# Patient Record
Sex: Male | Born: 1990 | Race: White | Hispanic: No | Marital: Single | Smoking: Never smoker
Health system: Southern US, Community
[De-identification: ages and names within clinical notes are randomized; demographics above are authoritative.]

## PROBLEM LIST (undated history)

## (undated) DIAGNOSIS — T7840XA Allergy, unspecified, initial encounter: Secondary | ICD-10-CM

## (undated) DIAGNOSIS — F32A Depression, unspecified: Secondary | ICD-10-CM

## (undated) DIAGNOSIS — F988 Other specified behavioral and emotional disorders with onset usually occurring in childhood and adolescence: Secondary | ICD-10-CM

## (undated) DIAGNOSIS — F419 Anxiety disorder, unspecified: Secondary | ICD-10-CM

## (undated) HISTORY — PX: TONSILLECTOMY: SUR1361

## (undated) HISTORY — DX: Other specified behavioral and emotional disorders with onset usually occurring in childhood and adolescence: F98.8

## (undated) HISTORY — PX: OTHER SURGICAL HISTORY: SHX169

## (undated) HISTORY — DX: Allergy, unspecified, initial encounter: T78.40XA

---

## 2012-08-03 ENCOUNTER — Ambulatory Visit: Payer: BC Managed Care – PPO

## 2012-08-03 ENCOUNTER — Ambulatory Visit (INDEPENDENT_AMBULATORY_CARE_PROVIDER_SITE_OTHER): Payer: BC Managed Care – PPO | Admitting: Family Medicine

## 2012-08-03 VITALS — BP 123/75 | HR 86 | Temp 98.4°F | Resp 16 | Ht 69.0 in | Wt 151.0 lb

## 2012-08-03 DIAGNOSIS — Z0000 Encounter for general adult medical examination without abnormal findings: Secondary | ICD-10-CM

## 2012-08-03 DIAGNOSIS — Z23 Encounter for immunization: Secondary | ICD-10-CM

## 2012-08-03 NOTE — Progress Notes (Signed)
[address]   [place], [place]  [address]   [phone]  Subjective:    Patient ID: Gregory Mcdonald, male    DOB: April 27, 1991, 21 y.o.   MRN: [id number]  HPIThis 21 y.o. male presents for evaluation of CPE.  Last physical 2009.  Tetanus vaccine UTD.  Flu vaccine this visit.  Hepatitis B series in past.  Gardisil never.  Eye exam recently; +glasses and contacts. Dental exam every six months.     Review of Systems  Constitutional: Negative for fever, chills, diaphoresis, activity change, appetite change, fatigue and unexpected weight change.  HENT: Positive for voice change. Negative for hearing loss, ear pain, nosebleeds, congestion, sore throat, facial swelling, rhinorrhea, sneezing, drooling, mouth sores, trouble swallowing, neck pain, neck stiffness, dental problem, postnasal drip, sinus pressure, tinnitus and ear discharge.   Eyes: Negative for photophobia, pain, discharge, redness, itching and visual disturbance.  Respiratory: Negative for apnea, cough, choking, chest tightness, shortness of breath, wheezing and stridor.   Cardiovascular: Negative for chest pain, palpitations and leg swelling.  Gastrointestinal: Negative for nausea, vomiting, abdominal pain, diarrhea and constipation.  Genitourinary: Negative for urgency, frequency, hematuria, discharge, genital sores, penile pain and testicular pain.  Musculoskeletal: Negative for myalgias, back pain, joint swelling, arthralgias and gait problem.  Skin: Negative for color change, pallor, rash and wound.  Neurological: Negative for dizziness, tremors, syncope, weakness, light-headedness, numbness and headaches.  Hematological: Negative for adenopathy. Does not bruise/bleed easily.  Psychiatric/Behavioral: Negative for suicidal ideas, hallucinations, behavioral problems, confusion, disturbed wake/sleep cycle, self-injury and dysphoric mood. The patient is not nervous/anxious and is not hyperactive.         Past Medical History    Diagnosis Date  . Allergy   . ADD (attention deficit disorder)     Diagnosed age 52.  Last treatment 2012; Ritalin PRN but does not tolerate side effects.    Past Surgical History  Procedure Date  . Tonsillectomy   . Admission     age 33 for constipation.    Prior to Admission medications   Not on File    No Known Allergies  History   Social History  . Marital Status: Single    Spouse Name: N/A    Number of Children: N/A  . Years of Education: N/A   Occupational History  . Not on file.   Social History Main Topics  . Smoking status: Never Smoker   . Smokeless tobacco: Never Used  . Alcohol Use: 6.0 oz/week    10 Cans of beer per week  . Drug Use: Yes    Special: Marijuana  . Sexually Active: Yes    Birth Control/ Protection: None, Condom     Last sexual activity 04/2012.  Total partners=1; male partners only.   Other Topics Concern  . Not on file   Social History Narrative   Marital: single; casually dating; just ended relationship after 2.5 years.  Lives: on campus with suite mates.   Children: none   Education: currently in school at [employer]; senior; major geography.   Study abroad; wants to go to [place].  Employed: none    Family History  Problem Relation Age of Onset  . Depression Father     Objective:   Physical Exam  Nursing note and vitals reviewed. Constitutional: He is oriented to person, place, and time. He appears well-developed and well-nourished. No distress.  HENT:  Head: Normocephalic and atraumatic.  Right Ear: External ear normal.  Left Ear: External ear normal.  Nose: Nose normal.  Mouth/Throat: Oropharynx is clear and moist.  Eyes: Conjunctivae normal and EOM are normal. Pupils are equal, round, and reactive to light.  Neck: Normal range of motion. Neck supple. No thyromegaly present.  Cardiovascular: Normal rate, regular rhythm, normal heart sounds and intact distal pulses.   No murmur heard. Pulmonary/Chest: Effort normal and  breath sounds normal. No respiratory distress. He has no wheezes. He has no rales.  Abdominal: Soft. Bowel sounds are normal. He exhibits no distension. There is no tenderness. There is no rebound and no guarding. Hernia confirmed negative in the right inguinal area and confirmed negative in the left inguinal area.  Genitourinary: Testes normal and penis normal. Right testis shows no mass, no swelling and no tenderness. Left testis shows no mass, no swelling and no tenderness.  Musculoskeletal: Normal range of motion.  Lymphadenopathy:    He has no cervical adenopathy.       Right: No inguinal adenopathy present.       Left: No inguinal adenopathy present.  Neurological: He is alert and oriented to person, place, and time. He has normal reflexes.  Skin: Skin is warm and dry. No rash noted. He is not diaphoretic. No erythema.       LARGE HYPERPIGMENTED MACULAR RASH L ANTERIOR ABDOMEN 10 CM X 14 CM.  Psychiatric: He has a normal mood and affect. His behavior is normal. Judgment and thought content normal.       [facility] reading (PRIMARY) by  Dr. Katrinka Blazing.  CXR: NAD.  INFLUENZA VACCINE ADMINISTERED.   Assessment & Plan:   1. Routine general medical examination at a health care facility  DG Chest 2 View  2.  Influenza Vaccine   1.  CPE: anticipatory guidance ----   Safe sex practices reviewed; recommend seatbelt use at all times.  Reviewed immunizations; to consider Gardisil series in future.  S/p influenza vaccine.  No evidence of depression.  Normal growth and development. 2. Influenza vaccine administered.

## 2012-09-14 NOTE — Progress Notes (Signed)
Reviewed and agree.

## 2016-10-17 ENCOUNTER — Emergency Department (HOSPITAL_COMMUNITY): Payer: Managed Care, Other (non HMO)

## 2016-10-17 ENCOUNTER — Emergency Department (HOSPITAL_COMMUNITY)
Admission: EM | Admit: 2016-10-17 | Discharge: 2016-10-17 | Disposition: A | Discharge status: Home / Self Care | Payer: Managed Care, Other (non HMO) | Attending: Emergency Medicine | Admitting: Emergency Medicine

## 2016-10-17 ENCOUNTER — Encounter (HOSPITAL_COMMUNITY): Payer: Self-pay | Admitting: Emergency Medicine

## 2016-10-17 DIAGNOSIS — F909 Attention-deficit hyperactivity disorder, unspecified type: Secondary | ICD-10-CM | POA: Insufficient documentation

## 2016-10-17 DIAGNOSIS — R55 Syncope and collapse: Secondary | ICD-10-CM | POA: Diagnosis not present

## 2016-10-17 DIAGNOSIS — K529 Noninfective gastroenteritis and colitis, unspecified: Secondary | ICD-10-CM | POA: Diagnosis not present

## 2016-10-17 DIAGNOSIS — R112 Nausea with vomiting, unspecified: Secondary | ICD-10-CM | POA: Diagnosis present

## 2016-10-17 LAB — URINALYSIS, ROUTINE W REFLEX MICROSCOPIC
BACTERIA UA: NONE SEEN
BILIRUBIN URINE: NEGATIVE
Glucose, UA: NEGATIVE mg/dL
Hgb urine dipstick: NEGATIVE
Ketones, ur: 5 mg/dL — AB
Leukocytes, UA: NEGATIVE
Nitrite: NEGATIVE
PH: 6 (ref 5.0–8.0)
PROTEIN: 30 mg/dL — AB
Specific Gravity, Urine: 1.028 (ref 1.005–1.030)
Squamous Epithelial / LPF: NONE SEEN

## 2016-10-17 LAB — COMPREHENSIVE METABOLIC PANEL
ALT: 26 U/L (ref 17–63)
AST: 25 U/L (ref 15–41)
Albumin: 4.3 g/dL (ref 3.5–5.0)
Alkaline Phosphatase: 46 U/L (ref 38–126)
Anion gap: 9 (ref 5–15)
BUN: 20 mg/dL (ref 6–20)
CHLORIDE: 95 mmol/L — AB (ref 101–111)
CO2: 33 mmol/L — AB (ref 22–32)
CREATININE: 0.96 mg/dL (ref 0.61–1.24)
Calcium: 8.6 mg/dL — ABNORMAL LOW (ref 8.9–10.3)
Glucose, Bld: 120 mg/dL — ABNORMAL HIGH (ref 65–99)
POTASSIUM: 3.8 mmol/L (ref 3.5–5.1)
SODIUM: 137 mmol/L (ref 135–145)
Total Bilirubin: 0.8 mg/dL (ref 0.3–1.2)
Total Protein: 7.2 g/dL (ref 6.5–8.1)

## 2016-10-17 LAB — CBC
HEMATOCRIT: 47.7 % (ref 39.0–52.0)
HEMOGLOBIN: 16.9 g/dL (ref 13.0–17.0)
MCH: 29.6 pg (ref 26.0–34.0)
MCHC: 35.4 g/dL (ref 30.0–36.0)
MCV: 83.7 fL (ref 78.0–100.0)
Platelets: 169 10*3/uL (ref 150–400)
RBC: 5.7 MIL/uL (ref 4.22–5.81)
RDW: 12.9 % (ref 11.5–15.5)
WBC: 8.1 10*3/uL (ref 4.0–10.5)

## 2016-10-17 LAB — LIPASE, BLOOD: LIPASE: 20 U/L (ref 11–51)

## 2016-10-17 MED ORDER — ONDANSETRON 8 MG PO TBDP: 8.0000 mg | ORAL_TABLET | Freq: Three times a day (TID) | ORAL | 0 refills | Status: DC | PRN

## 2016-10-17 MED ORDER — SODIUM CHLORIDE 0.9 % IV SOLN: INTRAVENOUS | Status: DC

## 2016-10-17 MED ORDER — SODIUM CHLORIDE 0.9 % IV BOLUS (SEPSIS)
2000.0000 mL | Freq: Once | INTRAVENOUS | Status: AC
  Administered 2016-10-17: 2000 mL via INTRAVENOUS

## 2016-10-17 MED ORDER — ONDANSETRON 4 MG PO TBDP
4.0000 mg | ORAL_TABLET | Freq: Once | ORAL | Status: AC | PRN
  Administered 2016-10-17: 4 mg via ORAL
  Filled 2016-10-17: qty 1

## 2016-10-17 NOTE — ED Triage Notes (Signed)
Pt reports intermittent nv/d for the past 3 days. Fell 2 nights ago and hit the back of his head and has felt confused since then. Keeping fluids down intermittently.

## 2016-10-17 NOTE — ED Provider Notes (Signed)
[facility]-EMERGENCY DEPT Provider Note   CSN: [id number] Arrival date & time: 10/17/16  0747     History   Chief Complaint Chief Complaint  Patient presents with  . Emesis  . Fall    HPI YGNACIO FECTEAU is a 26 y.o. male.  26 year old male presents with nausea vomiting diarrhea 3 days which is been intermittent. Patient states he is having increased amounts of alcohol for the past 2 days. Symptoms initially got better but then returned today. Did strike the back of his head a few days ago but denies any confusion. No abdominal discomfort. His emesis has been nonbilious or bloody. Denies any diarrhea. No fever or chills. Was concerned he may have had food poisoning. Denies any visual changes. No treatment use prior to arrival      Past Medical History:  Diagnosis Date  . ADD (attention deficit disorder)    Diagnosed age 58.  Last treatment 2012; Ritalin PRN but does not tolerate side effects.  . Allergy     There are no active problems to display for this patient.   Past Surgical History:  Procedure Laterality Date  . Admission     age 53 for constipation.  . TONSILLECTOMY         Home Medications    Prior to Admission medications   Not on File    Family History Family History  Problem Relation Age of Onset  . Depression Father     Social History Social History  Substance Use Topics  . Smoking status: Never Smoker  . Smokeless tobacco: Never Used  . Alcohol use 6.0 oz/week    10 Cans of beer per week     Allergies   Patient has no known allergies.   Review of Systems Review of Systems  All other systems reviewed and are negative.    Physical Exam Updated Vital Signs BP 112/69 (BP Location: Left Arm)   Pulse 95   Temp 98.7 F (37.1 C) (Oral)   Resp 18   SpO2 98%   Physical Exam  Constitutional: He is oriented to person, place, and time. He appears well-developed and well-nourished.  Non-toxic appearance. No distress.  HENT:  Head:  Normocephalic and atraumatic.  Eyes: Conjunctivae, EOM and lids are normal. Pupils are equal, round, and reactive to light.  Neck: Normal range of motion. Neck supple. No tracheal deviation present. No thyroid mass present.  Cardiovascular: Normal rate, regular rhythm and normal heart sounds.  Exam reveals no gallop.   No murmur heard. Pulmonary/Chest: Effort normal and breath sounds normal. No stridor. No respiratory distress. He has no decreased breath sounds. He has no wheezes. He has no rhonchi. He has no rales.  Abdominal: Soft. Normal appearance and bowel sounds are normal. He exhibits no distension. There is no tenderness. There is no rebound and no CVA tenderness.  Musculoskeletal: Normal range of motion. He exhibits no edema or tenderness.  Neurological: He is alert and oriented to person, place, and time. He has normal strength. No cranial nerve deficit or sensory deficit. GCS eye subscore is 4. GCS verbal subscore is 5. GCS motor subscore is 6.  Skin: Skin is warm and dry. No abrasion and no rash noted.  Psychiatric: He has a normal mood and affect. His speech is normal and behavior is normal.  Nursing note and vitals reviewed.    ED Treatments / Results  Labs (all labs ordered are listed, but only abnormal results are displayed) Labs Reviewed  COMPREHENSIVE METABOLIC  PANEL - Abnormal; Notable for the following:       Result Value   Chloride 95 (*)    CO2 33 (*)    Glucose, Bld 120 (*)    Calcium 8.6 (*)    All other components within normal limits  LIPASE, BLOOD  CBC  URINALYSIS, ROUTINE W REFLEX MICROSCOPIC    EKG  EKG Interpretation None       Radiology No results found.  Procedures Procedures (including critical care time)  Medications Ordered in ED Medications  0.9 %  sodium chloride infusion (not administered)  sodium chloride 0.9 % bolus 2,000 mL (not administered)  ondansetron (ZOFRAN-ODT) disintegrating tablet 4 mg (4 mg Oral Given 10/17/16 0805)      Initial Impression / Assessment and Plan / ED Course  I have reviewed the triage vital signs and the nursing notes.  Pertinent labs & imaging results that were available during my care of the patient were reviewed by me and considered in my medical decision making (see chart for details).  Clinical Course     Patient given IV fluids anti-medics here. Had a head CT due to history of head trauma which is negative for acute intracranial process. Suspect that patient did have some component of gastritis due to his current symptoms. Has a negative abdominal exam. Stable for discharge  Final Clinical Impressions(s) / ED Diagnoses   Final diagnoses:  None    New Prescriptions New Prescriptions   No medications on file     Lorre NickAnthony Keyerra Lamere, MD 10/17/16 1136

## 2017-12-14 IMAGING — CT CT HEAD W/O CM
3 of 4 series · 15 of 47 positions shown, 18 images · non-contrast
Comparison: None.

CLINICAL DATA: Intermittent nausea, vomiting, and diarrhea for 3
days, fell 2 nights ago hitting the back of the head with loss of
consciousness

EXAM:
CT HEAD WITHOUT CONTRAST
TECHNIQUE: Contiguous axial images were obtained from the base of the skull
through the vertex without intravenous contrast.

[Series 2: head w/o · axial · non-contrast · 0.45mm/px · z∈[-121,+4]mm · 9 of 33 slices shown, 12 images]
[im 4/33  brain]
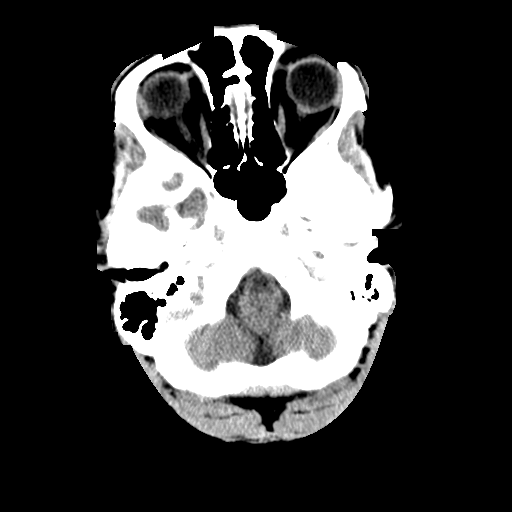
[im 4/33  bone]
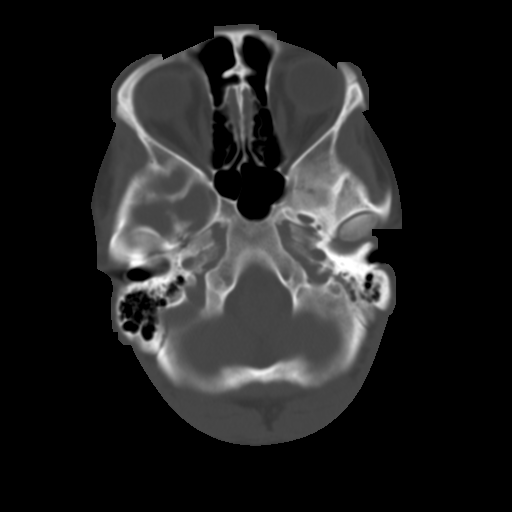
[im 7/33  brain]
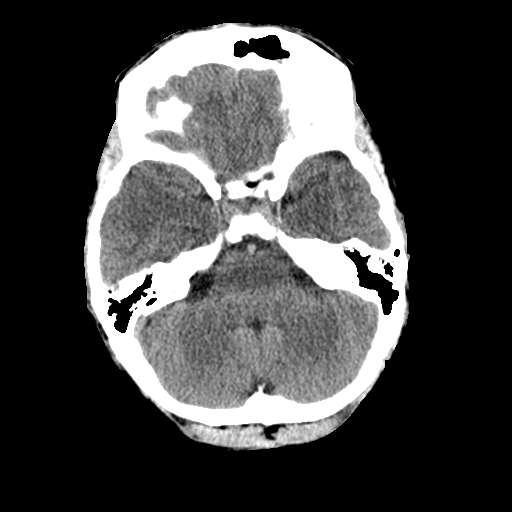
[im 10/33  brain]
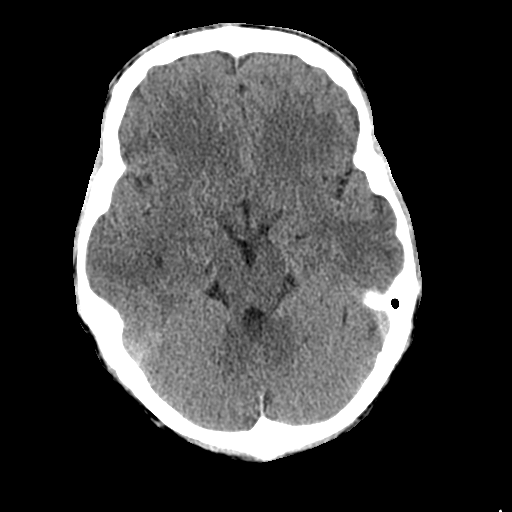
[im 13/33  brain]
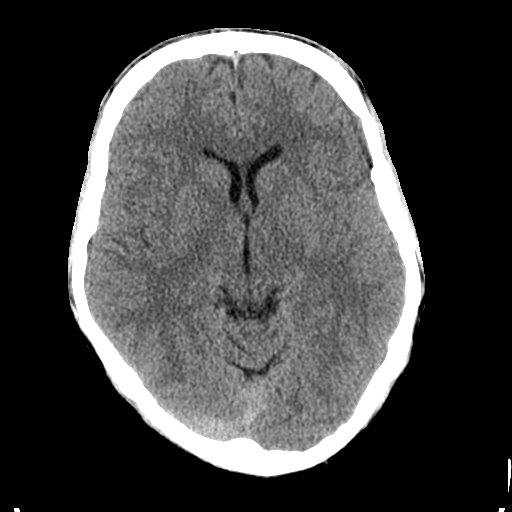
[im 17/33  brain]
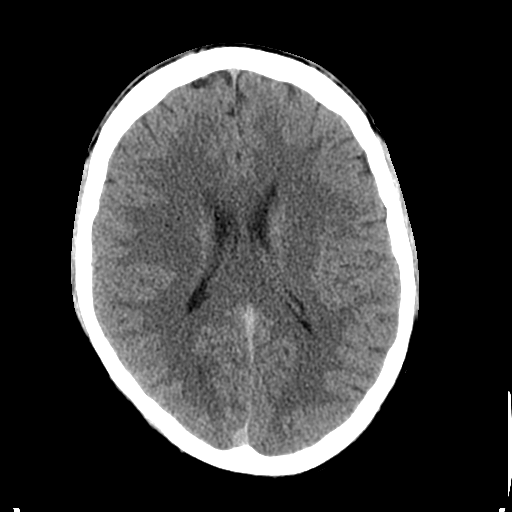
[im 17/33  bone]
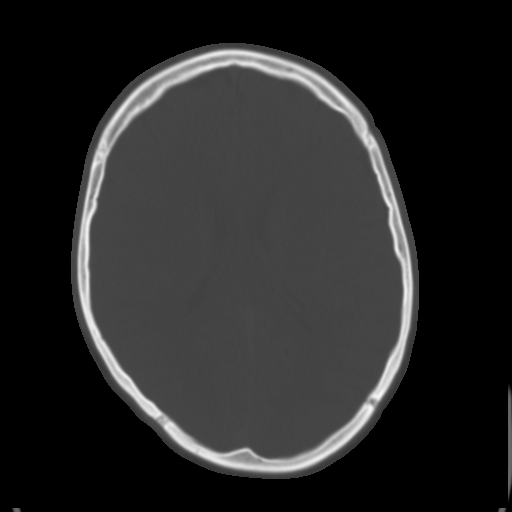
[im 20/33  brain]
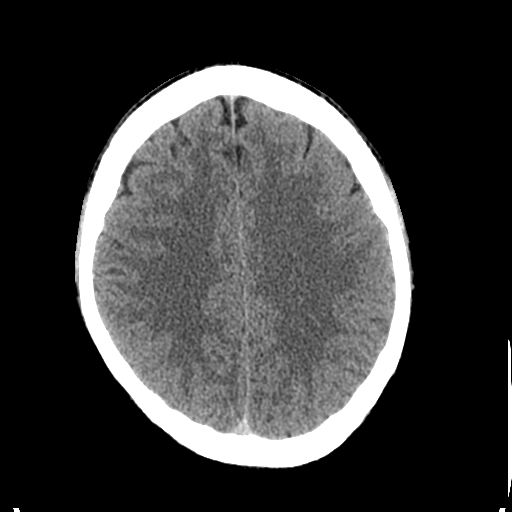
[im 23/33  brain]
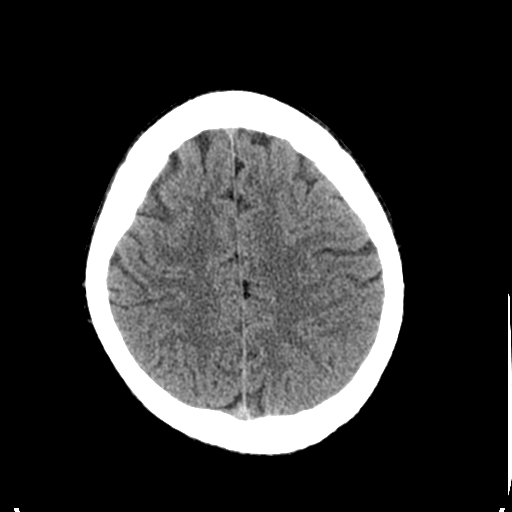
[im 26/33  brain]
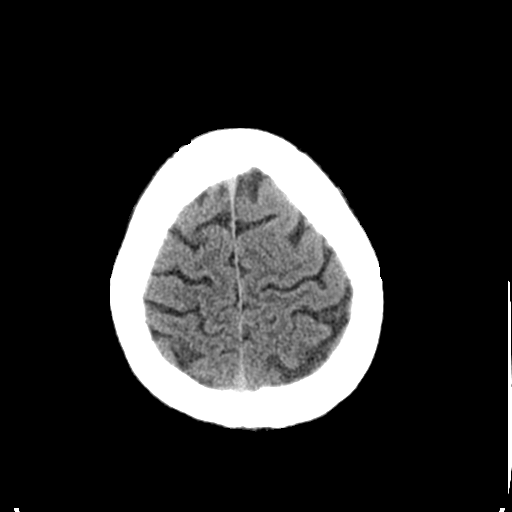
[im 29/33  brain]
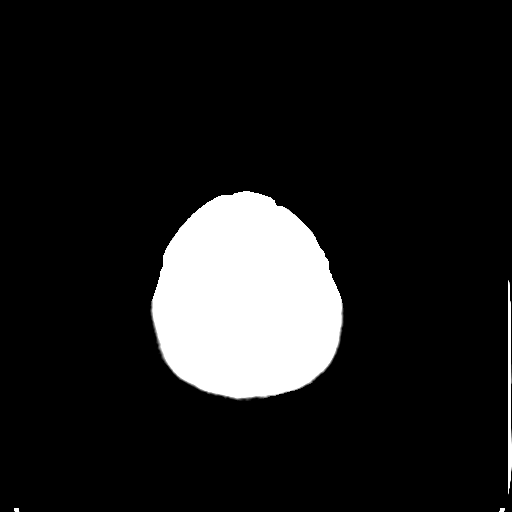
[im 29/33  bone]
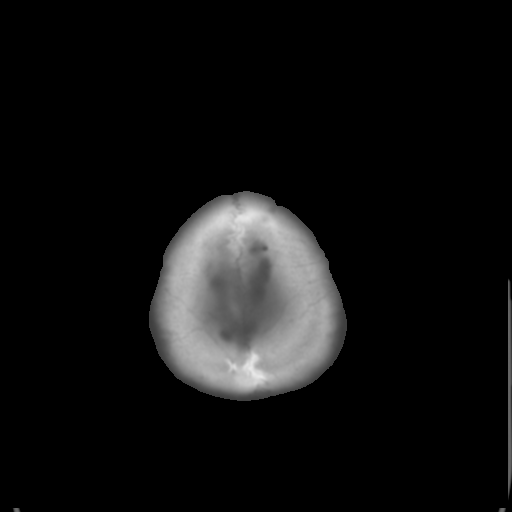

[Series 5: coronal · coronal · 0.31mm/px · 3 of 72 slices shown]
[im 24/72  brain]
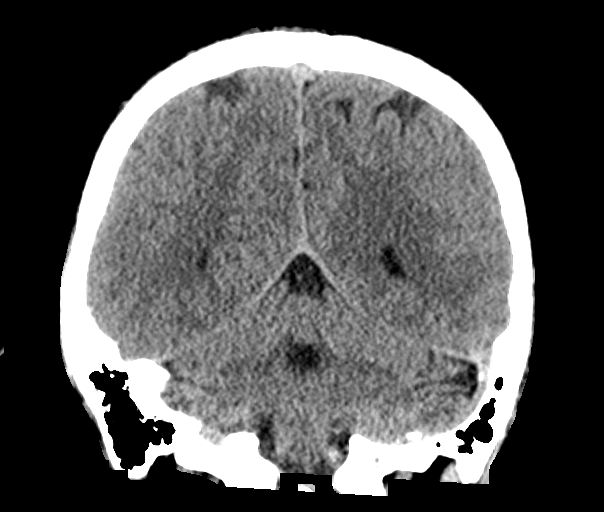
[im 32/72  brain]
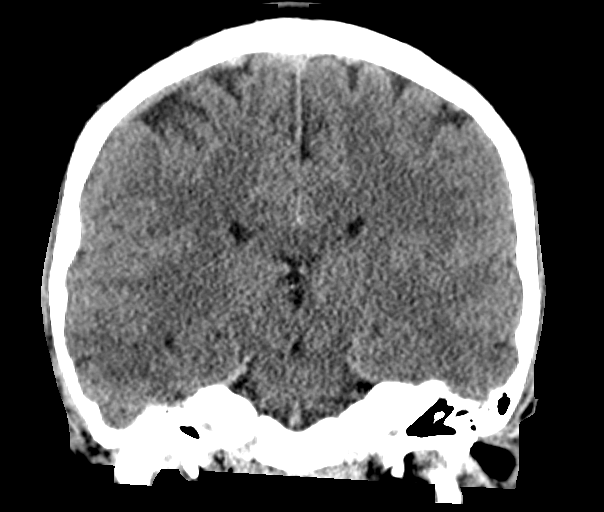
[im 40/72  brain]
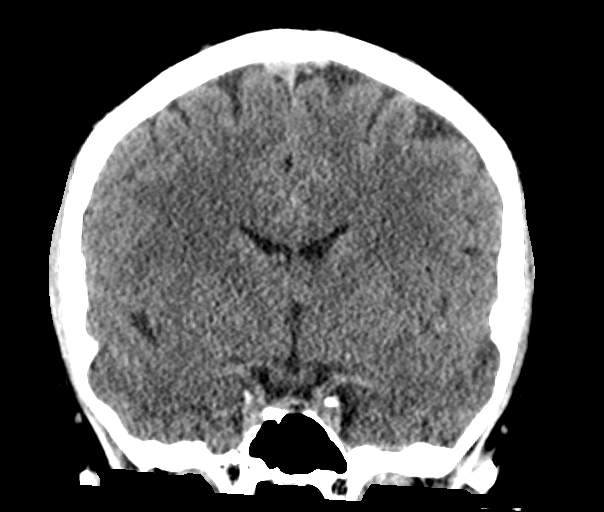

[Series 6: sagittal · sagittal · 0.31mm/px · 3 of 64 slices shown]
[im 22/64  brain]
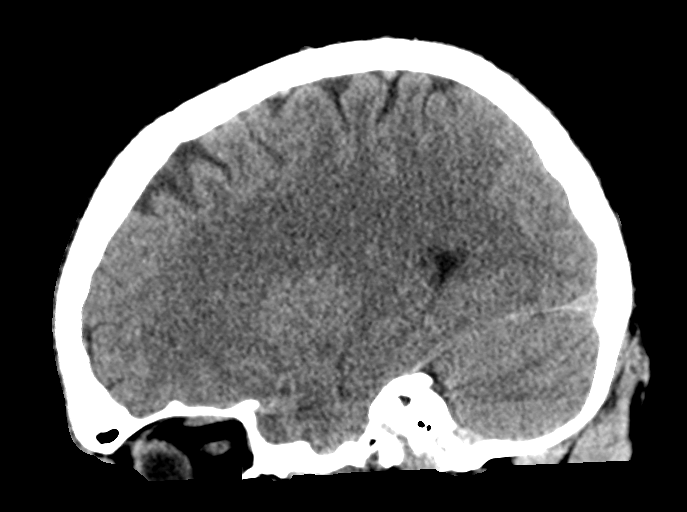
[im 32/64  brain]
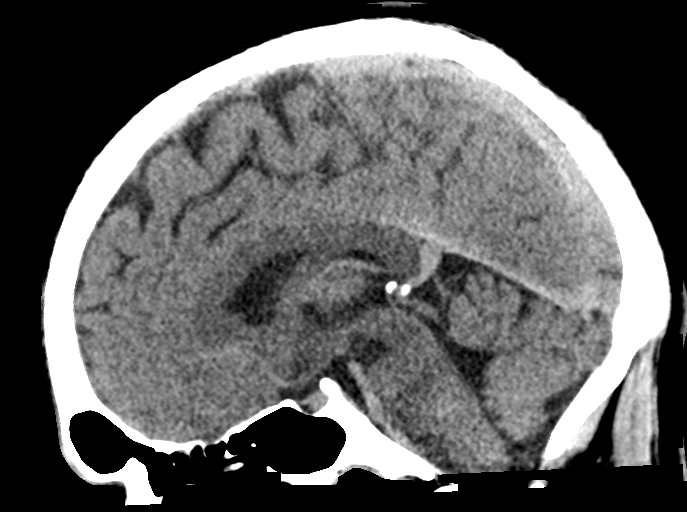
[im 43/64  brain]
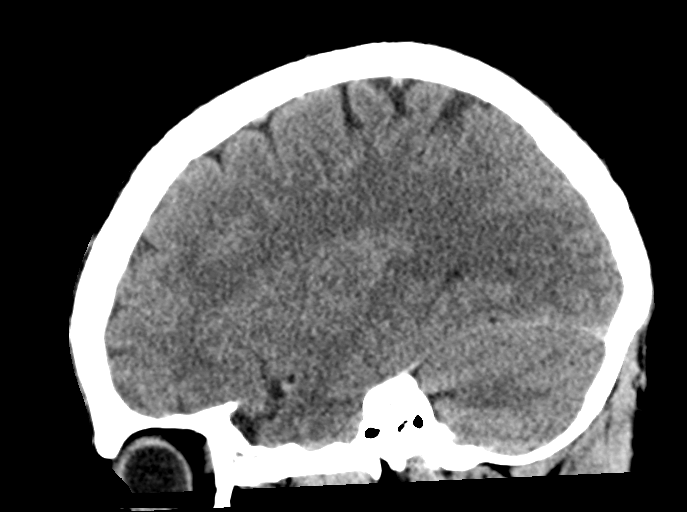

[15 of 47 positions shown; findings below may reference images not displayed]

FINDINGS: Brain: The ventricular system is normal in size and configuration
and the septum is midline in position. The fourth ventricle and
basilar cisterns are unremarkable. No hemorrhage, mass lesion, or
acute infarction is seen.

Vascular: No vascular abnormality is seen on this unenhanced study.

Skull: On bone window images, no calvarial abnormality is seen.

Sinuses/Orbits: The paranasal sinuses appear pneumatized.

Other: None.
IMPRESSION: Negative unenhanced CT of the brain.

## 2019-11-05 ENCOUNTER — Other Ambulatory Visit: Payer: Self-pay | Admitting: Gastroenterology

## 2019-11-05 DIAGNOSIS — R7989 Other specified abnormal findings of blood chemistry: Secondary | ICD-10-CM

## 2019-11-20 ENCOUNTER — Ambulatory Visit
Admission: RE | Admit: 2019-11-20 | Discharge: 2019-11-20 | Disposition: A | Discharge status: Home / Self Care | Payer: Managed Care, Other (non HMO) | Source: Ambulatory Visit | Attending: Gastroenterology | Admitting: Gastroenterology

## 2019-11-20 ENCOUNTER — Encounter (INDEPENDENT_AMBULATORY_CARE_PROVIDER_SITE_OTHER): Payer: Self-pay

## 2019-11-20 DIAGNOSIS — R7989 Other specified abnormal findings of blood chemistry: Secondary | ICD-10-CM

## 2020-03-18 ENCOUNTER — Encounter: Payer: Self-pay | Admitting: Psychiatry

## 2020-03-18 ENCOUNTER — Ambulatory Visit (INDEPENDENT_AMBULATORY_CARE_PROVIDER_SITE_OTHER): Payer: 59 | Admitting: Psychiatry

## 2020-03-18 ENCOUNTER — Other Ambulatory Visit: Payer: Self-pay

## 2020-03-18 DIAGNOSIS — F1010 Alcohol abuse, uncomplicated: Secondary | ICD-10-CM

## 2020-03-18 DIAGNOSIS — F4010 Social phobia, unspecified: Secondary | ICD-10-CM | POA: Diagnosis not present

## 2020-03-18 NOTE — Progress Notes (Signed)
[facility] Counselor Initial Adult Exam  Name: Gregory Mcdonald Date: 03/18/2020 MRN: [id number] DOB: 13-Mar-1991 PCP: Gregory James, MD  Time spent: 63 minutes   Guardian/Payee:  None    Paperwork requested:  No   Reason for Visit /Presenting Problem: This is a 29 year old white male who states that he has been struggling with his alcohol consumption for the last 2 to 3 years.  He approximates that he drinks 30 alcoholic beverages per week.  He is currently in treatment with Gregory Mcdonald.  He is currently taking 30 mg of Vyvanse for ADHD and naltrexone.  He has not had any DUIs.  It does not interfere with his work or his relationship.  He describes his mood has been good.  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  He does not identify any particular traumas but has some issues that he wants to try to address.  He has trouble focusing due to his ADHD which she states keeps him from enjoying things as much as he would like to.  He has social anxiety and considers himself very introverted.  "I am really shy."  He states that he has a good social life with a small group of friends.  He and his girlfriend have been living together for the past 3 years.  He states that relationship is going well.  He notices that he carries his anxiety as muscle tension in his lower back.  He also has difficulty with picking his fingers and biting his nails in a compulsive manner.  His overall anxiety on a 0-to-10 scale runs about a 3. The client describes his childhood as both "good and bad".  His parents divorced when he was 2.  He grew up with his mother in [place], [place].  His dad moved to [place], [place].  When he was 8 he and his mother moved to [place] where he graduated high school.  He felt untethered going back and forth between each parent.  He states he has a good relationship with both parents he does not identify any particular trauma. He graduated from  high school in [place] and attended [employer] for his undergraduate.  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 Goals 1) restructure negative thoughts to more appropriate positive thoughts. 2) increase overall satisfaction with life. 3) address his issues with focus and attention. 4) address his social anxiety. 5) decrease his alcohol use to a social level. The client has agreed to look at reducing the amount of alcohol he drinks each week to 21 drinks a week or less.  He will also pay attention to his negative cognitions and the things that make him feel anxious.  I encouraged the client to journal things down.  He has identified the following negative cognitions around self-loathing.  "I lack confidence.  If I apply myself I will not be successful.  I lack motivation."  I discussed the use of EMDR with the client and he agrees to treatment.  Mental Status Exam:   Appearance:   Casual     Behavior:  Appropriate  Motor:  Normal  Speech/Language:   Clear and Coherent  Affect:  Appropriate  Mood:  anxious  Thought process:  normal  Thought content:    WNL and cumpulsive fimger picking, nail biting.  Sensory/Perceptual disturbances:    WNL  Orientation:  oriented to person, place, time/date and situation  Attention:  Good  Concentration:  Good  Memory:  WNL  Fund of knowledge:   Good  Insight:    Good  Judgment:   Good  Impulse Control:  Good   Reported Symptoms:  Social anxiety, compulsive finger picking, nail biting, alcohol abuse.  Risk Assessment: Danger to Self:  No Self-injurious Behavior: No Danger to Others: No Duty to Warn:no Physical Aggression / Violence:No  Access to Firearms a concern: No  Gang Involvement:No  Patient / guardian was educated about steps to take if suicide or homicide risk level increases between visits: yes While future psychiatric events cannot be accurately predicted, the patient does not currently require acute inpatient psychiatric care and does not  currently meet [facility] involuntary commitment criteria.  Substance Abuse History: Current substance abuse: Yes     Past Psychiatric History:   Previous psychological history is significant for depression [facility] History of Psych Hospitalization: No  Psychological Testing: None   Abuse History: Victim of No., NA   Report needed: No. Victim of Neglect:No. Perpetrator of NA  Witness / Exposure to Domestic Violence: No   Protective Services Involvement: No  Witness to [employer] Violence:  No   Family History:  Family History  Problem Relation Age of Onset  . Depression Father     Living situation: the patient lives with an adult companion  Sexual Orientation:  Straight  Relationship Status: co-habitating  Name of spouse / other:Unknown             If a parent, number of children NA/ ages:NA  Support Systems; significant other  Financial Stress:  No   Income/Employment/Disability: Employment  Armed forces logistics/support/administrative officer: No   Educational History: Education: post Forensic psychologist work or degree  Religion/Sprituality/World View:   Protestant  Any cultural differences that Gregory Mcdonald affect / interfere with treatment:  not applicable   Recreation/Hobbies: Data processing manager  Stressors:Substance abuse  Strengths:  Supportive Relationships, Conservator, museum/gallery and Able to Communicate Effectively  Barriers:  None   Legal History: Pending legal issue / charges: The patient has no significant history of legal issues. History of legal issue / charges: None  Medical History/Surgical History:not reviewed Past Medical History:  Diagnosis Date  . ADD (attention deficit disorder)    Diagnosed age 29.  Last treatment 2012; Ritalin PRN but does not tolerate side effects.  . Allergy     Past Surgical History:  Procedure Laterality Date  . Admission     age 72 for constipation.  . TONSILLECTOMY      Medications: Current Outpatient Medications   Medication Sig Dispense Refill  . escitalopram (LEXAPRO) 20 MG tablet Take 20 mg by mouth daily.    . ondansetron (ZOFRAN ODT) 8 MG disintegrating tablet Take 1 tablet (8 mg total) by mouth every 8 (eight) hours as needed for nausea or vomiting. 20 tablet 0  . VYVANSE 20 MG capsule Take 20 mg by mouth daily as needed for other. ADHD  0   No current facility-administered medications for this visit.    No Known Allergies  Diagnoses:    ICD-10-CM   1. Social anxiety disorder  F40.10     Plan of Care: The client will work on reducing his alcohol intake from 30 drinks a week to 21 drinks a week or less.  He does not feel that his alcohol use interferes with his life but he is worried about his health.  We also discussed the use of EMDR as a way to restructure his negative cognitions to more appropriate rational  ones.  We will also work at reducing his overall anxiety related to social situations.  We will also do skills training and problem solving treatment.  Estimated length of treatment 10-20 sessions.   Gelene Mink Amiri Tritch, [facility]

## 2020-04-09 ENCOUNTER — Other Ambulatory Visit: Payer: Self-pay

## 2020-04-09 ENCOUNTER — Encounter: Payer: Self-pay | Admitting: Psychiatry

## 2020-04-09 ENCOUNTER — Ambulatory Visit (INDEPENDENT_AMBULATORY_CARE_PROVIDER_SITE_OTHER): Payer: 59 | Admitting: Psychiatry

## 2020-04-09 DIAGNOSIS — F1010 Alcohol abuse, uncomplicated: Secondary | ICD-10-CM | POA: Diagnosis not present

## 2020-04-09 DIAGNOSIS — F4010 Social phobia, unspecified: Secondary | ICD-10-CM | POA: Diagnosis not present

## 2020-04-09 NOTE — Progress Notes (Signed)
      [facility] Counselor/Therapist Progress Note  Patient ID: Gregory Mcdonald, MRN: [id number],    Date: 04/09/2020  Time Spent: 50 minutes   Treatment Type: Individual Therapy  Reported Symptoms: anxiety, alcohol abuse  Mental Status Exam:  Appearance:   Casual     Behavior:  Appropriate  Motor:  Normal  Speech/Language:   Clear and Coherent  Affect:  Appropriate  Mood:  anxious  Thought process:  normal  Thought content:    WNL  Sensory/Perceptual disturbances:    WNL  Orientation:  oriented to person, place, time/date and situation  Attention:  Good  Concentration:  Good  Memory:  WNL  Fund of knowledge:   Good  Insight:    Good  Judgment:   Good  Impulse Control:  Good   Risk Assessment: Danger to Self:  No Self-injurious Behavior: No Danger to Others: No Duty to Warn:no Physical Aggression / Violence:No  Access to Firearms a concern: No  Gang Involvement:No   Subjective: The client states that since last session he has been unable to control his binge drinking. He states he Gregory Mcdonald go a few days with a normal amount of alcohol but then will have a day or 2 where he binges with both liquor and beer. I discussed with the client why he thinks he drinks as much as he does? The client agreed in the discussion that it Gregory Mcdonald be because he is bored. He has some anxiety around his sexual relationship with his girlfriend. He is worried about finishing prematurely with sex. It causes him to feel inadequate. In the bigger picture he states, "I'm supposed to be doing something." He is unsure what that is. Today I used eye-movement with the client focusing on his anxiety around this. He feels it in his arms and chest. His subjective units of distress is a 6. The client states that he is never worked hard mainly because he did not want to fail. He also describes a lack of interest in things that were motivating for him. He is not sure that he wants to stay at his current job for the rest of  his life. He also states that he is not currently unhappy with his life the way it is. It does seem that there is an underlying malaise that the client is unable to get a hold off. He became more aware of his back pain. He states this has been an ongoing problem for a few years. He has seen physicians who tell him that everything is okay. I suggested to the client that maybe he is carrying his muscle tension in his back? The client thought this Gregory Mcdonald be true. As the client processed he was able to reduce his anxiety to a 3+. The client did not have much insight into the deeper nature of his anxiety. I encouraged him to pay attention to his anxiety and trying to put words to it. I also suggested that he write some things down and bring back to the session. The client agreed.  Interventions: Motivational Interviewing, Solution-Oriented/Positive Psychology, [name]  Diagnosis:   ICD-10-CM   1. Social anxiety disorder  F40.10   2. Alcohol abuse  F10.10     Plan: Journaling, positive self talk, exercise, reduced drinking.  Gregory Mcdonald, [facility]

## 2020-04-15 ENCOUNTER — Ambulatory Visit (INDEPENDENT_AMBULATORY_CARE_PROVIDER_SITE_OTHER): Payer: 59 | Admitting: Psychiatry

## 2020-04-15 ENCOUNTER — Other Ambulatory Visit: Payer: Self-pay

## 2020-04-15 DIAGNOSIS — F4010 Social phobia, unspecified: Secondary | ICD-10-CM | POA: Diagnosis not present

## 2020-04-16 ENCOUNTER — Encounter: Payer: Self-pay | Admitting: Psychiatry

## 2020-04-16 NOTE — Progress Notes (Signed)
      [facility] Counselor/Therapist Progress Note  Patient ID: Gregory Mcdonald, MRN: [id number],    Date: 04/15/2020  Time Spent: 50 minutes   Treatment Type: Individual Therapy  Reported Symptoms: anxiety, alcohol binge drinking  Mental Status Exam:  Appearance:   Well Groomed     Behavior:  Appropriate  Motor:  Normal  Speech/Language:   Clear and Coherent  Affect:  Appropriate  Mood:  anxious  Thought process:  normal  Thought content:    WNL  Sensory/Perceptual disturbances:    WNL  Orientation:  oriented to person, place, time/date and situation  Attention:  Good  Concentration:  Good  Memory:  WNL  Fund of knowledge:   Good  Insight:    Good  Judgment:   Good  Impulse Control:  Good   Risk Assessment: Danger to Self:  No Self-injurious Behavior: No Danger to Others: No Duty to Warn:no Physical Aggression / Violence:No  Access to Firearms a concern: No  Gang Involvement:No   Subjective: Client states that his stress is low today.  Over the weekend he drank quite a bit when he was in [place].  The last 2 days he has not drank.  He has started some cardio but has not worked up to 40 minutes or so.  He has tried to pursue some hobbies such as Journalist, newspaper. We discussed the client's trepidation about taking things on.  "I approach things cautiously."  He admits that he is risk aversive which Chariti Havel have to do with his personality style.  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  I suggested that he take the free test on [employer].  He agreed to do so. I also suggested that he look at the [facility] as well.  Both he and his girlfriend could take it and look at their styles and how they interact with each other.  He agreed to look at that. Today we talked about the client's alcohol use and how to behaviorally manage that in a better way.  The goal would be to have 21 drinks a week or less.  The client agreed  that this weekend he would have an alcoholic beverage and then drink a cup of water and alternate back and forth.  He will report back on how that works.  Interventions: Assertiveness/Communication, Motivational Interviewing, Solution-Oriented/Positive Psychology and Insight-Oriented  Diagnosis:   ICD-10-CM   1. Social anxiety disorder  F40.10     Plan: Monitor alcohol intak.,  Alternate between water and alcoholic beverages, increase exercise, pursue hobbies, self-care, positive self talk, take [name] type indicator test online.  Gelene Mink Wendolyn Raso, [facility]

## 2020-04-30 ENCOUNTER — Ambulatory Visit: Payer: 59 | Admitting: Psychiatry

## 2020-05-07 ENCOUNTER — Ambulatory Visit (INDEPENDENT_AMBULATORY_CARE_PROVIDER_SITE_OTHER): Payer: 59 | Admitting: Psychiatry

## 2020-05-07 ENCOUNTER — Encounter: Payer: Self-pay | Admitting: Psychiatry

## 2020-05-07 ENCOUNTER — Other Ambulatory Visit: Payer: Self-pay

## 2020-05-07 DIAGNOSIS — F4323 Adjustment disorder with mixed anxiety and depressed mood: Secondary | ICD-10-CM | POA: Diagnosis not present

## 2020-05-07 DIAGNOSIS — F1010 Alcohol abuse, uncomplicated: Secondary | ICD-10-CM | POA: Diagnosis not present

## 2020-05-07 NOTE — Progress Notes (Signed)
      [facility] Counselor/Therapist Progress Note  Patient ID: Gregory Mcdonald, MRN: [id number],    Date: 05/07/2020  Time Spent: 50 minutes   Treatment Type: Individual Therapy  Reported Symptoms: bored, anxious  Mental Status Exam:  Appearance:   Casual     Behavior:  Appropriate  Motor:  Normal  Speech/Language:   Clear and Coherent  Affect:  Appropriate  Mood:  anxious  Thought process:  normal  Thought content:    WNL  Sensory/Perceptual disturbances:    WNL  Orientation:  oriented to person, place, time/date and situation  Attention:  Good  Concentration:  Good  Memory:  WNL  Fund of knowledge:   Good  Insight:    Good  Judgment:   Good  Impulse Control:  Good   Risk Assessment: Danger to Self:  No Self-injurious Behavior: No Danger to Others: No Duty to Warn:no Physical Aggression / Violence:No  Access to Firearms a concern: No  Gang Involvement:No   Subjective: Client states that he spent last week at the beach on vacation with his girlfriend.  He read a novel, played golf and kayaked.  I asked the client how he did with his drinking?  "I have not really controlled my drinking."  We discussed this in more detail.  I asked the client why he thinks he drinks as much as he does?  He stated that drinking was a result of either boredom or not being satisfied with how he spent his day.  Since the client has not made any progress in controlling his drinking I asked the client if he liked the idea of not drinking as much or felt it was more of an imperative?  He thinks he likes the idea of not drinking.  He is not sure how invested he actually is in not doing it.  I discussed with the client the bigger issue of feeling bored and dissatisfied in his life.  He currently acknowledges that social anxiety is really not as much of an issue for him.  He does feel that he is bored in his job.  I asked the client if he had taken the Gregory Mcdonald type indicator?  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  We discussed careers connected to this personality style.  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  As we discussed in more detail making a transition to a different job/career the client did not seem as motivated to do so.  He also completed the Gregory Mcdonald and believes he is a #6.  I asked the client to cast his net wide to think about new skills he wants to require or new subjects he wants to learn about.  The hope is in doing this he Gregory Mcdonald come up on a career path that he would be willing to follow.  The client agreed.  Interventions: Motivational Interviewing, Solution-Oriented/Positive Psychology and Insight-Oriented  Diagnosis:   ICD-10-CM   1. Adjustment disorder with mixed anxiety and depressed mood  F43.23   2. Alcohol abuse  F10.10     Plan: Explore new skills to learn, explore new subjects to learn, mood independent behavior, positive self talk, self-care, exercise.  Gregory Mcdonald, [facility]

## 2020-05-14 ENCOUNTER — Ambulatory Visit: Payer: 59 | Admitting: Psychiatry

## 2020-05-28 ENCOUNTER — Encounter: Payer: Self-pay | Admitting: Psychiatry

## 2020-05-28 ENCOUNTER — Ambulatory Visit (INDEPENDENT_AMBULATORY_CARE_PROVIDER_SITE_OTHER): Payer: 59 | Admitting: Psychiatry

## 2020-05-28 ENCOUNTER — Other Ambulatory Visit: Payer: Self-pay

## 2020-05-28 DIAGNOSIS — F4323 Adjustment disorder with mixed anxiety and depressed mood: Secondary | ICD-10-CM | POA: Diagnosis not present

## 2020-05-28 NOTE — Progress Notes (Signed)
      [facility] Counselor/Therapist Progress Note  Patient ID: Gregory Mcdonald, MRN: [id number],    Date: 05/28/2020  Time Spent: 50 minutes   Treatment Type: Individual Therapy  Reported Symptoms: lack of motivation, anxiety  Mental Status Exam:  Appearance:   Casual     Behavior:  Appropriate  Motor:  Normal  Speech/Language:   Clear and Coherent  Affect:  Appropriate  Mood:  anxious and lack of motivation  Thought process:  normal  Thought content:    WNL  Sensory/Perceptual disturbances:    WNL  Orientation:  oriented to person, place, time/date and situation  Attention:  Good  Concentration:  Good  Memory:  WNL  Fund of knowledge:   Good  Insight:    Good  Judgment:   Good  Impulse Control:  Good   Risk Assessment: Danger to Self:  No Self-injurious Behavior: No Danger to Others: No Duty to Warn:no Physical Aggression / Violence:No  Access to Firearms a concern: No  Gang Involvement:No   Subjective: The client states that he went to a memorial for his grandmother who died last year in [place] this past weekend.  There was lots of drinking at the get together afterwards "but I toned it down a lot."  The client states that he does feel better and is not waking up with a hangover.  He has experimented with having 1 drink and then a glass of water and alternating that going forward.  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  He is thinking about applying for the job and has actually started doing some Music therapist.  He thinks he might use it as leverage to get a higher salary at his current job.  He is considering other options. The client states that his social anxiety has not been terrible.  "I have a hard time remembering people's names."  I discussed with the client that sometimes associating and name with an object, and animal, or a fruit or  vegetable can be helpful.  The client thinks this will be very helpful.  We also talked about conversation and using open ended questions versus closed ones.  He will continue to experiment with this going forward. The client is currently on 50 mg of Vyvanse.  He will talk with Dr. Evelene Croon about increasing it possibly to 60 mg to help with his focus and concentration.   Interventions: Assertiveness/Communication, Motivational Interviewing, Solution-Oriented/Positive Psychology and Insight-Oriented  Diagnosis:   ICD-10-CM   1. Adjustment disorder with mixed anxiety and depressed mood  F43.23     Plan: Mood independent behavior, positive self talk, associating names with objects, open ended versus closed ended questions, talk with Dr. Evelene Croon about meds.  Gregory Mcdonald, [facility]

## 2020-06-11 ENCOUNTER — Encounter: Payer: Self-pay | Admitting: Psychiatry

## 2020-06-11 ENCOUNTER — Ambulatory Visit (INDEPENDENT_AMBULATORY_CARE_PROVIDER_SITE_OTHER): Payer: 59 | Admitting: Psychiatry

## 2020-06-11 ENCOUNTER — Other Ambulatory Visit: Payer: Self-pay

## 2020-06-11 DIAGNOSIS — F4323 Adjustment disorder with mixed anxiety and depressed mood: Secondary | ICD-10-CM

## 2020-06-11 NOTE — Progress Notes (Signed)
      [facility] Counselor/Therapist Progress Note  Patient ID: Gregory Mcdonald, MRN: [id number],    Date: 06/11/2020  Time Spent: 50 minutes   Treatment Type: Individual Therapy  Reported Symptoms: anxious, lack of motivation  Mental Status Exam:  Appearance:   Casual     Behavior:  Appropriate  Motor:  Normal  Speech/Language:   Clear and Coherent  Affect:  Appropriate  Mood:  anxious and lack of motivation  Thought process:  normal  Thought content:    WNL  Sensory/Perceptual disturbances:    WNL  Orientation:  oriented to person, place, time/date and situation  Attention:  Good  Concentration:  Good  Memory:  WNL  Fund of knowledge:   Good  Insight:    Good  Judgment:   Good  Impulse Control:  Good   Risk Assessment: Danger to Self:  No Self-injurious Behavior: No Danger to Others: No Duty to Warn:no Physical Aggression / Violence:No  Access to Firearms a concern: No  Gang Involvement:No   Subjective: The client stated that 2 weeks ago on a Sunday he restarted his naltrexone.  He states he did not drink at all until the weekend.  "I wanted to unwind."  He describes it as having been a busy long week.  He states he did drink more than he should that weekend.  The client found the experience of the naltrexone to be helpful.  He has discontinued it but thinks he Gregory Mcdonald try it again.  He found during the time that he did not drink that he felt better in the morning and his anxiety was less.  He wants to be able to control his overall anxiety better.  I asked him if he was using his rowing machine which he had not done.  "I need to get in to a routine and that takes me about 2 weeks ".  The client has difficulty with his overall schedule.  He has his dog to walk in the morning and he has 1/2-hour drive in the morning and in the evening.  He and his girlfriend like to cook together and that takes up a lot of time at night.  He does not know when he could fit in exercise.  I  encouraged the client to continue to evaluate that.  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 The client has been focused on doing small projects around his house which has helped his overall mood.  The client also mentioned at the end of the session that he picks at his fingers a lot and would like some help in overcoming that.  The client does have psoriasis.  I suggested that he talk with his dermatologist to see if the psoriasis is impacting his fingers.  I stated we could also use EMDR to see if we could reduce that behavior.  Interventions: Motivational Interviewing, Solution-Oriented/Positive Psychology, [name]  Diagnosis:   ICD-10-CM   1. Adjustment disorder with mixed anxiety and depressed mood  F43.23     Plan: Attempt exercise, mood independent behavior, positive self talk, self-care, restart naltrexone, manage alcohol intake.  Gregory Mcdonald, [facility]

## 2020-07-02 ENCOUNTER — Ambulatory Visit (INDEPENDENT_AMBULATORY_CARE_PROVIDER_SITE_OTHER): Payer: 59 | Admitting: Psychiatry

## 2020-07-02 ENCOUNTER — Other Ambulatory Visit: Payer: Self-pay

## 2020-07-02 DIAGNOSIS — F4323 Adjustment disorder with mixed anxiety and depressed mood: Secondary | ICD-10-CM

## 2020-07-02 NOTE — Progress Notes (Signed)
      [facility] Counselor/Therapist Progress Note  Patient ID: VENTURA HOLLENBECK, MRN: [id number],    Date: 07/03/2020  Time Spent: 50 minutes   Treatment Type: Individual Therapy  Reported Symptoms: irritable, anxious  Mental Status Exam:  Appearance:   Casual     Behavior:  Appropriate  Motor:  Normal  Speech/Language:   Clear and Coherent  Affect:  Appropriate  Mood:  anxious and irritable  Thought process:  normal  Thought content:    WNL  Sensory/Perceptual disturbances:    WNL  Orientation:  oriented to person, place, time/date and situation  Attention:  Good  Concentration:  Good  Memory:  WNL  Fund of knowledge:   Good  Insight:    Good  Judgment:   Good  Impulse Control:  Good   Risk Assessment: Danger to Self:  No Self-injurious Behavior: No Danger to Others: No Duty to Warn:no Physical Aggression / Violence:No  Access to Firearms a concern: No  Gang Involvement:No   Subjective: The client states that the last couple weeks have been rough.  The previous 2 weekends the client was trying to complete large yard projects.  During one of them he contracted poison ivy.  "That has been difficult."  The client has suffered for the last few weeks.  I asked him why he did not seek treatment with his physician?  He was not aware that there was treatment available.  He states his girlfriend wants to do more work than he has the physical energy for.  He stated this has resulted in some irritability on his part. We discussed how the client could mitigate this.  I suggested that he limit the number of hours that he works so that he does not end up out of gas.  The client agreed.  This Tevion Laforge mean making adjustments to how quickly the projects will be done.  The client thought this was doable.  He will also look at increasing his exercise. 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  "There is more work than usual."  He describes that as making his job a little less  miserable because he does not have to find make work.  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  He also has taking some certification courses to increase his employability.  He still plans to find something outside of the city. Client also discussed some difficulty with sleep disruption.  He does attribute it to his alcohol use.  He states that his alcohol use has decreased over the last few weeks.  He agreed that he does need to sleep and so he Averil Digman dial back his alcohol use even more.  He is less than 30 drinks a week but not necessarily below 21.  He states he is still working towards this.  Interventions: Assertiveness/Communication, Motivational Interviewing, Solution-Oriented/Positive Psychology and Insight-Oriented  Diagnosis:   ICD-10-CM   1. Adjustment disorder with mixed anxiety and depressed mood  F43.23     Plan: Mood independent behavior, set boundaries for work and home projects, assertiveness, self-care, decrease alcohol use.  Gelene Mink Abigael Mogle, [facility]

## 2020-07-03 ENCOUNTER — Encounter: Payer: Self-pay | Admitting: Psychiatry

## 2020-07-16 ENCOUNTER — Ambulatory Visit (INDEPENDENT_AMBULATORY_CARE_PROVIDER_SITE_OTHER): Payer: 59 | Admitting: Psychiatry

## 2020-07-16 ENCOUNTER — Other Ambulatory Visit: Payer: Self-pay

## 2020-07-16 DIAGNOSIS — F4323 Adjustment disorder with mixed anxiety and depressed mood: Secondary | ICD-10-CM

## 2020-07-16 NOTE — Progress Notes (Signed)
      [facility] Counselor/Therapist Progress Note  Patient ID: Gregory Mcdonald, MRN: [id number],    Date: 07/17/2020  Time Spent: 50 minutes   Treatment Type: Individual Therapy  Reported Symptoms: anxiety, irritability, poor sleep  Mental Status Exam:  Appearance:   Casual     Behavior:  Appropriate  Motor:  Normal  Speech/Language:   Clear and Coherent  Affect:  Appropriate  Mood:  anxious and irritable  Thought process:  normal  Thought content:    WNL  Sensory/Perceptual disturbances:    WNL  Orientation:  oriented to person, place, time/date and situation  Attention:  Good  Concentration:  Good  Memory:  WNL  Fund of knowledge:   Good  Insight:    Good  Judgment:   Good  Impulse Control:  Good   Risk Assessment: Danger to Self:  No Self-injurious Behavior: No Danger to Others: No Duty to Warn:no Physical Aggression / Violence:No  Access to Firearms a concern: No  Gang Involvement:No   Subjective: The client states that he did apply for the new job with [employer] city.  He hopes to hear back in the next 2 weeks.  He states today that he needs to talk about his "social anxiety".  "It is easy for me to get overwhelmed."  The client reports that in the last few weeks he has had more trouble with his sleep.  He is able to go to sleep but he finds himself waking up through the night.  This has resulted in him being more irritable or easily overwhelmed in his relationship with his girlfriend.  He states that she gets upset with him when she is talking and he does not seem to listen.  I suggested to the client that he try to be more intentional with her using reflective listening as a tool to improve their communication.  He agreed.  As the client discussed this it seemed to be less about anxiety and more about his lack of sleep.  He does not describe being anxious in social settings for the most part.  He also states he tends to navigate his day fairly well without a lot of  anxiety.  He does believe that his decrease in drinking is related to his poor sleep.  If he drinks a lot he is able to sleep better.  As he has cut his drinking back he finds that his sleep is poor.  I discussed talking with Dr. Evelene Croon about this.  The client states that he has not seen Dr. Evelene Croon in a few months.  I encouraged the client to follow back up with her.  The client agreed to do this.  He also noted that when he was working on projects in his yard he tended to sleep better.  I discussed with the client the importance of regular physical activity to maintain a good sleep schedule.  The client agrees to do so.  He will also explore over-the-counter supplements that might be helpful for his sleep such as 5 HTP or magnesium.  Interventions: Assertiveness/Communication, Motivational Interviewing, Solution-Oriented/Positive Psychology and Insight-Oriented  Diagnosis:   ICD-10-CM   1. Adjustment disorder with mixed anxiety and depressed mood  F43.23     Plan: Reflective listening, mood independent behavior, regular exercise, self-care, positive self talk, assertiveness, boundaries.  Gelene Mink Lorin Gawron, [facility]

## 2020-07-17 ENCOUNTER — Encounter: Payer: Self-pay | Admitting: Psychiatry

## 2020-07-27 ENCOUNTER — Encounter: Payer: Self-pay | Admitting: Psychiatry

## 2020-07-27 ENCOUNTER — Other Ambulatory Visit: Payer: Self-pay

## 2020-07-27 ENCOUNTER — Ambulatory Visit (INDEPENDENT_AMBULATORY_CARE_PROVIDER_SITE_OTHER): Payer: 59 | Admitting: Psychiatry

## 2020-07-27 DIAGNOSIS — F4321 Adjustment disorder with depressed mood: Secondary | ICD-10-CM

## 2020-07-27 NOTE — Progress Notes (Signed)
      [facility] Counselor/Therapist Progress Note  Patient ID: Gregory Mcdonald, MRN: [id number],    Date: 07/27/2020  Time Spent: 50 minutes   Treatment Type: Individual Therapy  Reported Symptoms: irritable  Mental Status Exam:  Appearance:   Well Groomed     Behavior:  Appropriate  Motor:  Normal  Speech/Language:   Clear and Coherent  Affect:  Appropriate  Mood:  irritable  Thought process:  normal  Thought content:    WNL  Sensory/Perceptual disturbances:    WNL  Orientation:  oriented to person, place, time/date and situation  Attention:  Good  Concentration:  Good  Memory:  WNL  Fund of knowledge:   Good  Insight:    Good  Judgment:   Good  Impulse Control:  Good   Risk Assessment: Danger to Self:  No Self-injurious Behavior: No Danger to Others: No Duty to Warn:no Physical Aggression / Violence:No  Access to Firearms a concern: No  Gang Involvement:No   Subjective: The client states that his sleep has improved using the l-theanine.  "It is been very helpful."  The client has also restarted his naltrexone 30 mg and has increased his Vyvanse to 50 mg after discussion with Dr. Evelene Croon.  The client has intentionally decreased his drinking.  He had an event in the past week where he had drank more alcohol than he thought.  He was washing dishes and dropped a pottery bowl which broke.  "I got upset with myself.  My girlfriend tried to help.  I got angry and frustrated but not at her.  She became afraid and left."  This was an eye-opening event for the client see how the alcohol had impacted his mood.  He called his girlfriend up at 12:30 at night to request she come home.  She did.  They talked through what was going on.  He has decreased his alcohol use.  The client states he and his girlfriend have been very busy with social engagements.  He continues to do projects for his house on the weekends.  I asked the client what he thought about his social anxiety?  As we discussed  it it was clear that the client really did not have social anxiety but really more of a problem with irritability.  We discussed the use of regular exercise better sleep and decreased alcohol use.  The client is hopeful that the naltrexone will give him some help in that arena. Overall the client seems to be doing fairly well with good insight.  He needs to implement what we have discussed in the past more regularly.  He agrees.  Interventions: Assertiveness/Communication, Motivational Interviewing, Solution-Oriented/Positive Psychology and Insight-Oriented  Diagnosis:   ICD-10-CM   1. Adjustment disorder with depressed mood  F43.21     Plan: Decrease alcohol use to less than 21 drinks a week, continue with naltrexone, positive self talk, exercise, self-care, regulate sleep, l-theanine as needed, assertiveness, boundaries.  Gelene Mink Farzad Tibbetts, [facility]

## 2020-08-10 ENCOUNTER — Ambulatory Visit: Payer: 59 | Admitting: Psychiatry

## 2020-08-24 ENCOUNTER — Encounter: Payer: Self-pay | Admitting: Psychiatry

## 2020-08-24 ENCOUNTER — Ambulatory Visit (INDEPENDENT_AMBULATORY_CARE_PROVIDER_SITE_OTHER): Payer: 59 | Admitting: Psychiatry

## 2020-08-24 ENCOUNTER — Other Ambulatory Visit: Payer: Self-pay

## 2020-08-24 DIAGNOSIS — F4321 Adjustment disorder with depressed mood: Secondary | ICD-10-CM

## 2020-08-24 NOTE — Progress Notes (Signed)
      [facility] Counselor/Therapist Progress Note  Patient ID: MERVIL WACKER, MRN: [id number],    Date: 08/24/2020  Time Spent: 50 minutes   Treatment Type: Individual Therapy  Reported Symptoms: irritability  Mental Status Exam:  Appearance:   Casual     Behavior:  Appropriate  Motor:  Normal  Speech/Language:   Clear and Coherent  Affect:  Appropriate  Mood:  irritable  Thought process:  normal  Thought content:    WNL  Sensory/Perceptual disturbances:    WNL  Orientation:  oriented to person, place, time/date and situation  Attention:  Good  Concentration:  Good  Memory:  WNL  Fund of knowledge:   Good  Insight:    Good  Judgment:   Good  Impulse Control:  Good   Risk Assessment: Danger to Self:  No Self-injurious Behavior: No Danger to Others: No Duty to Warn:no Physical Aggression / Violence:No  Access to Firearms a concern: No  Gang Involvement:No   Subjective: Client reports that he has been drinking a little more mostly on the weekends.  He still feels that the naltrexone is helping him.  He feels his increase of drinking comes from boredom.  The client recently had his Vyvanse increased by 10 mg.  "I am not sure if the Vyvanse is helpful for me.  I find myself's tearing the skin around my fingers more.  It feels almost compulsive."  I encouraged the client to discuss this with Dr. Evelene Croon.  He has taken some holidays from the Vyvanse to see if he feels significantly different.  He did not see an appreciable difference.  He knows that he has ADHD because it had been diagnosed by the [place] [facility]. The client notes that overall his irritability has been variable.  He does notice that he gets cranky in the evening most likely because the Vyvanse is wearing off.  I suggested to the client that he preload with some l-theanine to see if he can reduce that irritability that he experiences.  He agrees that he has found it helpful in other ways so he  will try that.  We also discussed him increasing his exercise to 3 times a week.  The client knows that it would be helpful for him but has difficulty with motivation doing that.  I discussed the need for mood independent behavior to make that happen.  Also to think about intentionality.  If he could schedule 2I times during the week and 1 on the weekends that would be enough, just for 30 minutes each.  The client agreed that Yajayra Feldt be doable.  He says he just needs to get into a routine.  Interventions: Motivational Interviewing, Solution-Oriented/Positive Psychology and Insight-Oriented  Diagnosis:   ICD-10-CM   1. Adjustment disorder with depressed mood  F43.21     Plan: Mood independent behavior, positive self talk, self-care, intentionality, schedule exercise, use of l-theanine in the evening to reduce irritability.  Gelene Mink Felisha Claytor, [facility]

## 2020-09-10 ENCOUNTER — Ambulatory Visit: Payer: 59 | Admitting: Psychiatry

## 2020-10-19 ENCOUNTER — Other Ambulatory Visit: Payer: Self-pay

## 2020-10-19 ENCOUNTER — Ambulatory Visit (INDEPENDENT_AMBULATORY_CARE_PROVIDER_SITE_OTHER): Payer: 59 | Admitting: Psychiatry

## 2020-10-19 ENCOUNTER — Encounter: Payer: Self-pay | Admitting: Psychiatry

## 2020-10-19 DIAGNOSIS — F4321 Adjustment disorder with depressed mood: Secondary | ICD-10-CM | POA: Diagnosis not present

## 2020-10-19 NOTE — Progress Notes (Signed)
      [facility] Counselor/Therapist Progress Note  Patient ID: Gregory Mcdonald, MRN: [id number],    Date: 10/19/2020  Time Spent: 50 minutes   Treatment Type: Individual Therapy  Reported Symptoms: sad  Mental Status Exam:  Appearance:   Casual     Behavior:  Appropriate  Motor:  Normal  Speech/Language:   Clear and Coherent  Affect:  Appropriate  Mood:  sad  Thought process:  normal  Thought content:    WNL  Sensory/Perceptual disturbances:    WNL  Orientation:  oriented to person, place, time/date and situation  Attention:  Good  Concentration:  Good  Memory:  WNL  Fund of knowledge:   Good  Insight:    Good  Judgment:   Good  Impulse Control:  Good   Risk Assessment: Danger to Self:  No Self-injurious Behavior: No Danger to Others: No Duty to Warn:no Physical Aggression / Violence:No  Access to Firearms a concern: No  Gang Involvement:No   Subjective: The client states she took a long vacation over the holidays.  He discontinued the naltrexone and found himself drinking more heavily.  He also noticed that he had a higher level of dissatisfaction with how he was living his life.  He stated he needed to practice more mindfulness so that he can stay more grounded.  "I need to be more present."  He noticed that with his level of satisfaction, "nothing is really enough". We discussed mindfulness practices.  I will email the handout to the client.  We discussed other ways of exploring what is the malaise within himself.  I suggested to the client that he journal trying to deconstruct why he disconnects from his life through drinking and mindlessly listening to podcasts.  Client is concerned that his memories in the past have just evaporated.  I suggested that he talk to his parents and look at pictures from early childhood and try to write his story.  The client will consider this.  Interventions: Mindfulness Meditation, Motivational Interviewing, Solution-Oriented/Positive  Psychology, [name]  Diagnosis:   ICD-10-CM   1. Adjustment disorder with depressed mood  F43.21     Plan: Journaling, mood independent behavior, positive self talk, exercise, self-care, intentionality.  Gregory Mcdonald, [facility]

## 2020-11-02 ENCOUNTER — Ambulatory Visit: Payer: 59 | Admitting: Psychiatry

## 2020-11-26 ENCOUNTER — Ambulatory Visit (INDEPENDENT_AMBULATORY_CARE_PROVIDER_SITE_OTHER): Payer: 59 | Admitting: Psychiatry

## 2020-11-26 ENCOUNTER — Other Ambulatory Visit: Payer: Self-pay

## 2020-11-26 ENCOUNTER — Encounter: Payer: Self-pay | Admitting: Psychiatry

## 2020-11-26 DIAGNOSIS — F4321 Adjustment disorder with depressed mood: Secondary | ICD-10-CM | POA: Diagnosis not present

## 2020-11-26 NOTE — Progress Notes (Addendum)
      [facility] Counselor/Therapist Progress Note  Patient ID: PEDRAM GOODCHILD, MRN: [id number],    Date: 11/26/2020  Time Spent: 50 minutes   Treatment Type: Individual Therapy  Reported Symptoms: irritable  Mental Status Exam:  Appearance:   Casual and Well Groomed     Behavior:  Appropriate  Motor:  Normal  Speech/Language:   Clear and Coherent  Affect:  Appropriate  Mood:  irritable  Thought process:  normal  Thought content:    WNL  Sensory/Perceptual disturbances:    WNL  Orientation:  oriented to person, place, time/date and situation  Attention:  Good  Concentration:  Good  Memory:  WNL  Fund of knowledge:   Good  Insight:    Good  Judgment:   Good  Impulse Control:  Good   Risk Assessment: Danger to Self:  No Self-injurious Behavior: No Danger to Others: No Duty to Warn:no Physical Aggression / Violence:No  Access to Firearms a concern: No  Gang Involvement:No   Subjective: The client discussed his irritation with the transactional nature of his relationship.  He feels that he has a list of chores that he is responsible for and sometimes extra piled on top.  He realizes he sees his interaction with his girlfriend as always balancing the scales.  We discussed how this can build resentment over time to which the client agreed.  We discussed the nature of his relationship with his girlfriend.  What does love mean?  The client was unsure.  He readily admitted that he was willing to do things for his girlfriend without her necessarily paying him back.  I explained to the client that having an expectation of reciprocity all the time can add a toxic element to their relationship.  Taking an unconditional attitude can be much more helpful as long as it is balanced. The client identified also that what he thought was social anxiety is really irritability.  He discusses that when he goes on a walk with his dog if he does not see anyone he feels great.  He has an expectation  that he must  acknowledge somebody if he happens to walk past them.  I pointed out to the client that this is not necessarily true and he could always look the other way as he is passing someone.  There is not a requirement to interact.  The client agreed and will work on this.  We also discussed what he was doing with exercise?  The short answer is not enough.  The client states that he and his girlfriend are planning on trying to run 3 mornings a week.  They will continue to work on radical acceptance.  Today we used the bilateral stimulation hand paddles as the client talked about his relationship and his irritability.  He was able to his irritability from the subjective units of distress of 5+ to less than 2.  Interventions: Assertiveness/Communication, Motivational Interviewing, Solution-Oriented/Positive Psychology, [name]  Diagnosis:   ICD-10-CM   1. Adjustment disorder with depressed mood  F43.21     Plan: Mood independent behavior, positive self talk, assertiveness, boundaries, self-care, exercise, radical acceptance.  Gelene Mink Brandt Chaney, [facility]

## 2020-12-24 ENCOUNTER — Ambulatory Visit (INDEPENDENT_AMBULATORY_CARE_PROVIDER_SITE_OTHER): Payer: 59 | Admitting: Psychiatry

## 2020-12-24 ENCOUNTER — Other Ambulatory Visit: Payer: Self-pay

## 2020-12-24 ENCOUNTER — Encounter: Payer: Self-pay | Admitting: Psychiatry

## 2020-12-24 DIAGNOSIS — F4321 Adjustment disorder with depressed mood: Secondary | ICD-10-CM | POA: Diagnosis not present

## 2020-12-24 NOTE — Progress Notes (Signed)
      [facility] Counselor/Therapist Progress Note  Patient ID: JAKORIAN MARENGO, MRN: [id number],    Date: 12/24/2020  Time Spent: 50 minutes   Treatment Type: Individual Therapy  Reported Symptoms: irritable  Mental Status Exam:  Appearance:   Casual     Behavior:  Appropriate  Motor:  Normal  Speech/Language:   Clear and Coherent  Affect:  Appropriate  Mood:  irritable  Thought process:  normal  Thought content:    WNL  Sensory/Perceptual disturbances:    WNL  Orientation:  oriented to person, place, time/date and situation  Attention:  Good  Concentration:  Good  Memory:  WNL  Fund of knowledge:   Good  Insight:    Good  Judgment:   Good  Impulse Control:  Good   Risk Assessment: Danger to Self:  No Self-injurious Behavior: No Danger to Others: No Duty to Warn:no Physical Aggression / Violence:No  Access to Firearms a concern: No  Gang Involvement:No   Subjective: The client stopped his Vyvanse due to the increased irritability.  His psychiatrist has put him on a new nonstimulant medication.  He is uncertain the name of the medication.  He states that he has had a positive effect and it has not caused irritability.  He has found that he can interpret criticism as personal when it is really not.  The hope is that this medicine will help curb that. 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  If he is able to get it it will be closer to his home with less of a commute.  I went over the client's initial goals.  He feels that he is managing his alcohol well.  He is less than 21 drinks a week.  He has found that his social anxiety is very low.  He feels that he has very few negative thoughts these days.  He also notices that his overall satisfaction in life has increased.  The client agrees that all his goals have been met.  We ended services by mutual consent.  Interventions: Motivational  Interviewing, Solution-Oriented/Positive Psychology and Insight-Oriented  Diagnosis:   ICD-10-CM   1. Adjustment disorder with depressed mood  F43.21     Plan: Continue self-care, positive self talk, assertiveness, boundaries, exercise, follow-up as needed.  Chattie Greeson, [facility]

## 2021-01-17 ENCOUNTER — Ambulatory Visit: Payer: 59 | Admitting: Psychiatry

## 2021-02-04 ENCOUNTER — Ambulatory Visit: Payer: 59 | Admitting: Psychiatry

## 2021-02-18 ENCOUNTER — Ambulatory Visit: Payer: 59 | Admitting: Psychiatry

## 2021-03-04 ENCOUNTER — Ambulatory Visit: Payer: 59 | Admitting: Psychiatry

## 2023-11-09 ENCOUNTER — Other Ambulatory Visit: Payer: Self-pay | Admitting: Urology

## 2023-11-12 ENCOUNTER — Encounter (HOSPITAL_BASED_OUTPATIENT_CLINIC_OR_DEPARTMENT_OTHER): Payer: Self-pay | Admitting: Urology

## 2023-11-12 NOTE — H&P (Signed)
 38 M here for vas consult   Reason for vas: does not desire children  # of children: no children  Relationship: has wife decided not have children  Contraception: wife on birth control  Previous GU surgery: no previous surgeries  blood thinners: no blood thinners        ALLERGIES: No Known Allergies    MEDICATIONS: Fluoxetine Hcl 40 mg capsule  Topiramate 50 mg tablet  Vyvanse 40 mg capsule     GU PSH: None   NON-GU PSH: Tonsillectomy     GU PMH: None   NON-GU PMH: Anxiety GERD    FAMILY HISTORY: Colon Cancer - Father pancreatic cancer - Grandmother throat cancer - Grandfather   SOCIAL HISTORY: Marital Status: Married Preferred Language: English; Ethnicity: Not Hispanic Or Latino Current Smoking Status: Patient does not smoke anymore.   Tobacco Use Assessment Completed: Used Tobacco in last 30 days? Does not use smokeless tobacco. Does drink.  Drinks 2 caffeinated drinks per day. Has not had a blood transfusion.    REVIEW OF SYSTEMS:    GU Review Male:   Patient reports frequent urination and hard to postpone urination. Patient denies burning/ pain with urination, get up at night to urinate, leakage of urine, stream starts and stops, trouble starting your stream, have to strain to urinate , erection problems, and penile pain.  Gastrointestinal (Upper):   Patient reports indigestion/ heartburn. Patient denies nausea and vomiting.  Gastrointestinal (Lower):   Patient denies diarrhea and constipation.  Constitutional:   Patient denies fever, night sweats, weight loss, and fatigue.  Skin:   Patient denies skin rash/ lesion and itching.  Eyes:   Patient denies blurred vision and double vision.  Ears/ Nose/ Throat:   Patient denies sore throat and sinus problems.  Hematologic/Lymphatic:   Patient denies swollen glands and easy bruising.  Cardiovascular:   Patient denies leg swelling and chest pains.  Respiratory:   Patient denies cough and shortness of breath.   Endocrine:   Patient denies excessive thirst.  Musculoskeletal:   Patient denies back pain and joint pain.  Neurological:   Patient denies headaches and dizziness.  Psychologic:   Patient denies depression and anxiety.   VITAL SIGNS: None   GU PHYSICAL EXAMINATION:    Scrotum: High riding testicles, small scrotum, significant thick cords that is very difficult to palpate especially on the left side would likely be more amenable to vasectomy and OR.  Penis: Circumcised penis, no nodules or glans or shaft no irregularities noted.    MULTI-SYSTEM PHYSICAL EXAMINATION:    Constitutional: Well-nourished. No physical deformities. Normally developed. Good grooming.  Respiratory: No labored breathing, no use of accessory muscles.   Cardiovascular: Normal temperature, normal extremity pulses, no swelling, no varicosities.  Neurologic / Psychiatric: Oriented to time, oriented to place, oriented to person. No depression, no anxiety, no agitation.  Musculoskeletal: Normal gait and station of head and neck.     Complexity of Data:  Source Of History:  Patient   PROCEDURES:          Visit Complexity - G2211    ASSESSMENT:      ICD-10 Details  1 NON-GU:   Encounter for vasectomy consultation - Z30.09 Undiagnosed New Problem   PLAN:            Medications Stop Meds: Lexapro 20 mg tablet  Discontinue: 11/07/2023  - Reason: The medication cycle was completed.            Document Letter(s):  Created  for Patient: Clinical Summary         Notes:   Vasectomy consult: Anatomy not amenable to vasectomy in clinic we will plan for vasectomy in the OR. Pt is here today for OR vasectomy.   We discussed risk benefits alternatives to the procedure. Patient understands that this should be considered a permanent sterilization technique and then reversal success decreases with each subsequent year after vasectomy. Risk discussed include failure at a rate of 1 in 2000, infection, bleeding resulting in  hematoma, injury to surrounding structures including the testicles, and possible persistent testicular pain. Patient voices understanding and consent was obtained.

## 2023-11-12 NOTE — Progress Notes (Signed)
Spoke w/ via phone for pre-op interview--- Para March Lab needs dos----  NONE       Lab results------ COVID test -----patient states asymptomatic no test needed Arrive at -------0745 NPO after MN NO Solid Food.  Med rec completed Medications to take morning of surgery ----- Prozac and Topamax Diabetic medication ----- Patient instructed no nail polish to be worn day of surgery Patient instructed to bring photo id and insurance card day of surgery Patient aware to have Driver (ride ) / caregiver    for 24 hours after surgery - Wife Gregory Mcdonald Patient Special Instructions ----- Pre-Op special Instructions ----- Patient verbalized understanding of instructions that were given at this phone interview. Patient denies chest pain, sob, fever, cough at the interview.

## 2023-11-13 ENCOUNTER — Other Ambulatory Visit: Payer: Self-pay

## 2023-11-13 ENCOUNTER — Encounter (HOSPITAL_BASED_OUTPATIENT_CLINIC_OR_DEPARTMENT_OTHER): Payer: Self-pay | Admitting: Urology

## 2023-11-13 ENCOUNTER — Ambulatory Visit (HOSPITAL_BASED_OUTPATIENT_CLINIC_OR_DEPARTMENT_OTHER)
Admission: RE | Admit: 2023-11-13 | Discharge: 2023-11-13 | Disposition: A | Discharge status: Home / Self Care | Payer: 59 | Attending: Urology | Admitting: Urology

## 2023-11-13 ENCOUNTER — Ambulatory Visit (HOSPITAL_BASED_OUTPATIENT_CLINIC_OR_DEPARTMENT_OTHER): Payer: 59 | Admitting: Anesthesiology

## 2023-11-13 ENCOUNTER — Encounter (HOSPITAL_BASED_OUTPATIENT_CLINIC_OR_DEPARTMENT_OTHER)
Admission: RE | Disposition: A | Discharge status: Home / Self Care | Payer: Self-pay | Source: Home / Self Care | Attending: Urology

## 2023-11-13 DIAGNOSIS — Z302 Encounter for sterilization: Secondary | ICD-10-CM | POA: Diagnosis not present

## 2023-11-13 HISTORY — DX: Anxiety disorder, unspecified: F41.9

## 2023-11-13 HISTORY — DX: Depression, unspecified: F32.A

## 2023-11-13 HISTORY — PX: VASECTOMY: SHX75

## 2023-11-13 SURGERY — VASECTOMY
Anesthesia: General | Site: Scrotum | Laterality: Bilateral

## 2023-11-13 MED ORDER — FENTANYL CITRATE (PF) 100 MCG/2ML IJ SOLN
INTRAMUSCULAR | Status: AC
  Filled 2023-11-13: qty 2

## 2023-11-13 MED ORDER — ONDANSETRON HCL 4 MG/2ML IJ SOLN
INTRAMUSCULAR | Status: AC
  Filled 2023-11-13: qty 2

## 2023-11-13 MED ORDER — ONDANSETRON HCL 4 MG/2ML IJ SOLN
INTRAMUSCULAR | Status: DC | PRN
  Administered 2023-11-13: 4 mg via INTRAVENOUS

## 2023-11-13 MED ORDER — KETOROLAC TROMETHAMINE 30 MG/ML IJ SOLN
INTRAMUSCULAR | Status: DC | PRN
  Administered 2023-11-13: 30 mg via INTRAVENOUS

## 2023-11-13 MED ORDER — PROPOFOL 10 MG/ML IV BOLUS
INTRAVENOUS | Status: AC
  Filled 2023-11-13: qty 20

## 2023-11-13 MED ORDER — MIDAZOLAM HCL 5 MG/5ML IJ SOLN
INTRAMUSCULAR | Status: DC | PRN
  Administered 2023-11-13: 2 mg via INTRAVENOUS

## 2023-11-13 MED ORDER — LIDOCAINE HCL (CARDIAC) PF 100 MG/5ML IV SOSY
PREFILLED_SYRINGE | INTRAVENOUS | Status: DC | PRN
  Administered 2023-11-13: 100 mg via INTRAVENOUS

## 2023-11-13 MED ORDER — CEFAZOLIN SODIUM-DEXTROSE 2-4 GM/100ML-% IV SOLN
INTRAVENOUS | Status: AC
Start: 2023-11-13 — End: ?
  Filled 2023-11-13: qty 100

## 2023-11-13 MED ORDER — DEXAMETHASONE SODIUM PHOSPHATE 4 MG/ML IJ SOLN
INTRAMUSCULAR | Status: DC | PRN
  Administered 2023-11-13 (×2): 5 mg via INTRAVENOUS

## 2023-11-13 MED ORDER — KETOROLAC TROMETHAMINE 30 MG/ML IJ SOLN
INTRAMUSCULAR | Status: AC
Start: 2023-11-13 — End: ?
  Filled 2023-11-13: qty 1

## 2023-11-13 MED ORDER — FENTANYL CITRATE (PF) 100 MCG/2ML IJ SOLN: 25.0000 ug | INTRAMUSCULAR | Status: DC | PRN

## 2023-11-13 MED ORDER — DIPHENHYDRAMINE HCL 50 MG/ML IJ SOLN
INTRAMUSCULAR | Status: DC | PRN
  Administered 2023-11-13: 12.5 mg via INTRAVENOUS

## 2023-11-13 MED ORDER — DEXAMETHASONE SODIUM PHOSPHATE 10 MG/ML IJ SOLN
INTRAMUSCULAR | Status: AC
  Filled 2023-11-13: qty 1

## 2023-11-13 MED ORDER — OXYCODONE HCL 5 MG/5ML PO SOLN: 5.0000 mg | Freq: Once | ORAL | Status: DC | PRN

## 2023-11-13 MED ORDER — STERILE WATER FOR IRRIGATION IR SOLN
Status: DC | PRN
  Administered 2023-11-13: 1000 mL

## 2023-11-13 MED ORDER — CEFAZOLIN SODIUM-DEXTROSE 2-4 GM/100ML-% IV SOLN
2.0000 g | INTRAVENOUS | Status: AC
  Administered 2023-11-13: 2 g via INTRAVENOUS

## 2023-11-13 MED ORDER — ACETAMINOPHEN 500 MG PO TABS
ORAL_TABLET | ORAL | Status: AC
  Filled 2023-11-13: qty 2

## 2023-11-13 MED ORDER — DROPERIDOL 2.5 MG/ML IJ SOLN: 0.6250 mg | Freq: Once | INTRAMUSCULAR | Status: DC | PRN

## 2023-11-13 MED ORDER — LACTATED RINGERS IV SOLN: INTRAVENOUS | Status: DC

## 2023-11-13 MED ORDER — ACETAMINOPHEN 500 MG PO TABS
1000.0000 mg | ORAL_TABLET | Freq: Once | ORAL | Status: AC
  Administered 2023-11-13: 1000 mg via ORAL

## 2023-11-13 MED ORDER — ACETAMINOPHEN 160 MG/5ML PO SOLN
325.0000 mg | ORAL | Status: DC | PRN
Start: 2023-11-13 — End: 2023-11-13

## 2023-11-13 MED ORDER — OXYCODONE HCL 5 MG PO TABS: 5.0000 mg | ORAL_TABLET | Freq: Once | ORAL | Status: DC | PRN

## 2023-11-13 MED ORDER — LIDOCAINE HCL (PF) 1 % IJ SOLN
INTRAMUSCULAR | Status: DC | PRN
  Administered 2023-11-13: 5 mL

## 2023-11-13 MED ORDER — DIPHENHYDRAMINE HCL 50 MG/ML IJ SOLN
INTRAMUSCULAR | Status: AC
Start: 2023-11-13 — End: ?
  Filled 2023-11-13: qty 1

## 2023-11-13 MED ORDER — PROPOFOL 10 MG/ML IV BOLUS
INTRAVENOUS | Status: AC
Start: 2023-11-13 — End: ?
  Filled 2023-11-13: qty 20

## 2023-11-13 MED ORDER — LIDOCAINE HCL (PF) 2 % IJ SOLN
INTRAMUSCULAR | Status: AC
Start: 2023-11-13 — End: ?
  Filled 2023-11-13: qty 5

## 2023-11-13 MED ORDER — MIDAZOLAM HCL 2 MG/2ML IJ SOLN
INTRAMUSCULAR | Status: AC
  Filled 2023-11-13: qty 2

## 2023-11-13 MED ORDER — FENTANYL CITRATE (PF) 100 MCG/2ML IJ SOLN
INTRAMUSCULAR | Status: DC | PRN
  Administered 2023-11-13: 100 ug via INTRAVENOUS

## 2023-11-13 MED ORDER — HYDROCODONE-ACETAMINOPHEN 5-325 MG PO TABS: 1.0000 | ORAL_TABLET | ORAL | 0 refills | Status: AC | PRN

## 2023-11-13 MED ORDER — ACETAMINOPHEN 325 MG PO TABS
325.0000 mg | ORAL_TABLET | ORAL | Status: DC | PRN
Start: 2023-11-13 — End: 2023-11-13

## 2023-11-13 MED ORDER — PROPOFOL 10 MG/ML IV BOLUS
INTRAVENOUS | Status: DC | PRN
  Administered 2023-11-13: 200 mg via INTRAVENOUS
  Administered 2023-11-13 (×2): 100 mg via INTRAVENOUS

## 2023-11-13 MED ORDER — ACETAMINOPHEN 10 MG/ML IV SOLN: 1000.0000 mg | Freq: Once | INTRAVENOUS | Status: DC | PRN

## 2023-11-13 SURGICAL SUPPLY — 27 items
BLADE CLIPPER SENSICLIP SURGIC (BLADE) ×1 IMPLANT
BLADE SURG 15 STRL LF DISP TIS (BLADE) ×1 IMPLANT
BNDG GAUZE DERMACEA FLUFF 4 (GAUZE/BANDAGES/DRESSINGS) ×1 IMPLANT
COVER BACK TABLE 60X90IN (DRAPES) ×1 IMPLANT
COVER MAYO STAND STRL (DRAPES) ×1 IMPLANT
DRAPE LAPAROTOMY 100X72 PEDS (DRAPES) ×1 IMPLANT
DRAPE UTILITY XL STRL (DRAPES) IMPLANT
ELECT REM PT RETURN 9FT ADLT (ELECTROSURGICAL) ×1
ELECTRODE REM PT RTRN 9FT ADLT (ELECTROSURGICAL) ×1 IMPLANT
GLOVE BIOGEL PI IND STRL 6.5 (GLOVE) IMPLANT
GLOVE SS BIOGEL STRL SZ 8 (GLOVE) ×1 IMPLANT
GLOVE SURG SS PI 6.5 STRL IVOR (GLOVE) IMPLANT
GOWN STRL REUS W/ TWL LRG LVL3 (GOWN DISPOSABLE) IMPLANT
GOWN STRL REUS W/TWL XL LVL3 (GOWN DISPOSABLE) ×1 IMPLANT
KIT TURNOVER CYSTO (KITS) ×1 IMPLANT
NDL HYPO 25X1 1.5 SAFETY (NEEDLE) ×1 IMPLANT
NEEDLE HYPO 25X1 1.5 SAFETY (NEEDLE) ×1
PACK BASIN DAY SURGERY FS (CUSTOM PROCEDURE TRAY) ×1 IMPLANT
PENCIL SMOKE EVACUATOR (MISCELLANEOUS) ×1 IMPLANT
SLEEVE SCD COMPRESS KNEE MED (STOCKING) ×1 IMPLANT
SUPPORT SCROTAL MED ADLT STRP (MISCELLANEOUS) ×1 IMPLANT
SUT CHROMIC 3 0 SH 27 (SUTURE) ×1 IMPLANT
SUT MNCRL AB 4-0 PS2 18 (SUTURE) ×1 IMPLANT
SUT VIC AB 3-0 PS2 18XBRD (SUTURE) ×1 IMPLANT
SYR CONTROL 10ML LL (SYRINGE) ×1 IMPLANT
TOWEL OR 17X24 6PK STRL BLUE (TOWEL DISPOSABLE) ×1 IMPLANT
WATER STERILE IRR 1000ML POUR (IV SOLUTION) IMPLANT

## 2023-11-13 NOTE — Op Note (Signed)
 Pre operative diagnosis: desires sterilization  Post op diagnosis: same   Procedure: Bilateral vasectomy   Indication: Elective sterilization.   Consent: from the patient . Specific risks including, but not limited to bleeding, infection, pain, allergic reaction, recanalization, sperm granuloma formation, etc. were explained.   Specimen: bilateral vas deferens    Anesthesia: general   Dressing:. Fluffs   Patient Status:. The patient tolerated the procedure well.   Complications:. There were no complications.   Procedure: The patient has reviewed information regarding the risks and complications and understands the advantages and disadvantages of elective sterilization. With full informed written and oral consent, he has elected to proceed with vasectomy.   Patient was taken to the OR and placed under general anesthesia by the anesthesia team.  The patient was placed in the supine position, and the genitalia was sterilely prepped with betadine and draped.  Time out was then performed verifying correct patient and procedure.  1% lidocaine  was used for local anesthesia of the skin and perivasal tissue in the midline and in the area around the right vas and left vas using the Madajet XL. The right vas was then grasped through the skin using the non-piercing vas clamp, the skin was pierced with a single blade of the sharpened hemostat. The sharpened hemostat was then placed through this incision, and the perivasal tissue cleared from around the vas itself. The vas was then delivered through the wound and clamped with a vas clamp, a segment of approximately 1 cm of vasal tissue was cleared, two hemostats were then used to clamp each end of the cleared vas, and the isolated segment was cut. The silk suture was then tied around each clamp, the excess suture was then cut, and the vas was allowed to drop back into the scrotum after observation for any bleeding. The contralateral vas was then  treated in an identical fashion. Any further bleeding points were cauterized. The wound was then observed for any bleeding, and none was noted. The incisions were then closed with 3-0 chromic gut suture. The patient was then extubated and taken to the PACU in stable condition.    The patient tolerated the procedure well and without complications. A sterile gauze dressing was applied, and the patient's tight fitting underwear used to support the scrotum and hold the dressing in place. He was sent home with instructions regarding wound care and signs of infection, and recommended to restrict activity for 48 hours. He was informed he should not consider himself sterile until 2 subsequent semen analyses have been noted to be completely free of sperm.

## 2023-11-13 NOTE — Anesthesia Preprocedure Evaluation (Addendum)
Anesthesia Evaluation  Patient identified by MRN, date of birth, ID band Patient awake    Reviewed: Allergy & Precautions, NPO status , Patient's Chart, lab work & pertinent test results  Airway Mallampati: I  TM Distance: >3 FB Neck ROM: Full    Dental  (+) Teeth Intact, Dental Advisory Given   Pulmonary    breath sounds clear to auscultation       Cardiovascular  Rhythm:Regular Rate:Normal     Neuro/Psych    GI/Hepatic   Endo/Other    Renal/GU      Musculoskeletal   Abdominal   Peds  Hematology   Anesthesia Other Findings   Reproductive/Obstetrics                             Anesthesia Physical Anesthesia Plan  ASA: 2  Anesthesia Plan: General   Post-op Pain Management: Tylenol PO (pre-op)*   Induction: Intravenous  PONV Risk Score and Plan: 3 and Ondansetron, Dexamethasone and Midazolam  Airway Management Planned: LMA  Additional Equipment: None  Intra-op Plan:   Post-operative Plan: Extubation in OR  Informed Consent: I have reviewed the patients History and Physical, chart, labs and discussed the procedure including the risks, benefits and alternatives for the proposed anesthesia with the patient or authorized representative who has indicated his/her understanding and acceptance.     Dental advisory given  Plan Discussed with: CRNA  Anesthesia Plan Comments:        Anesthesia Quick Evaluation

## 2023-11-13 NOTE — Discharge Instructions (Addendum)
 Discharge instructions following scrotal surgery  Call your doctor for: Fever is greater than 100.5 Severe nausea or vomiting Increasing pain not controlled by pain medication Increasing redness or drainage from incisions  The number for questions or concerns is [phone]  Activity level: No lifting greater than 20 pounds (about equal to milk) for the next 2 weeks or until cleared to do so at follow-up appointment.  Otherwise activity as tolerated by comfort level.  Diet: May resume your regular diet as tolerated  Driving: No driving while still taking opiate pain medications (weight at least 6-8 hours after last dose).  No driving if you still sore from surgery as it may limit her ability to react quickly if necessary.   Shower/bath: May shower and get incision wet pad dry immediately following.  Do not scrub vigorously for the next 2-3 weeks.  Do not soak incision (ID soaking in bath or swimming) until told he may do so by Dr., as this may promote a wound infection.  Wound care: He may cover wounds with sterile gauze as needed to prevent incisions rubbing on close follow-up in any seepage.  Where tight fitting underpants/scrotal support for at least 2 weeks.  He should apply cold compresses (ice or sac of frozen peas/corn) to your scrotum for at least 48 hours to reduce the swelling for 15 minutes at a time indirectly.  You should expect that his scrotum will swell up initially and then get smaller over the next 2-4 weeks.  Follow-up appointments: Your instructions have been given to you for post vasectomy sperm analysis which should be dropped off in 8 weeks          No acetaminophen /Tylenol  until after 2:45 pm today if needed.     Post Anesthesia Home Care Instructions  Activity: Get plenty of rest for the remainder of the day. A responsible individual must stay with you for 24 hours following the procedure.  For the next 24 hours, DO NOT: -Drive a car -Social worker -Drink alcoholic beverages -Take any medication unless instructed by your physician -Make any legal decisions or sign important papers.  Meals: Start with liquid foods such as gelatin or soup. Progress to regular foods as tolerated. Avoid greasy, spicy, heavy foods. If nausea and/or vomiting occur, drink only clear liquids until the nausea and/or vomiting subsides. Call your physician if vomiting continues.  Special Instructions/Symptoms: Your throat may feel dry or sore from the anesthesia or the breathing tube placed in your throat during surgery. If this causes discomfort, gargle with warm salt water . The discomfort should disappear within 24 hours.

## 2023-11-13 NOTE — Transfer of Care (Signed)
 Immediate Anesthesia Transfer of Care Note  Patient: Gregory Mcdonald  Procedure(s) Performed: Procedure(s) (LRB): VASECTOMY (Bilateral)  Patient Location: PACU  Anesthesia Type: GA  Level of Consciousness: awake, sedated, patient cooperative and responds to stimulation, c/o pain in back - comfort measures given w/ medication   Airway & Oxygen Therapy: Patient Spontanous Breathing and Patient connected to [place] oxygen  Post-op Assessment: Report given to PACU RN, Post -op Vital signs reviewed and stable and Patient moving all extremities  Post vital signs: Reviewed and stable  Complications: No apparent anesthesia complications

## 2023-11-13 NOTE — Anesthesia Postprocedure Evaluation (Signed)
 Anesthesia Post Note  Patient: Gregory Mcdonald  Procedure(s) Performed: VASECTOMY (Bilateral: Scrotum)     Patient location during evaluation: PACU Anesthesia Type: General Level of consciousness: awake and alert Pain management: pain level controlled Vital Signs Assessment: post-procedure vital signs reviewed and stable Respiratory status: spontaneous breathing, nonlabored ventilation, respiratory function stable and patient connected to nasal cannula oxygen Cardiovascular status: blood pressure returned to baseline and stable Postop Assessment: no apparent nausea or vomiting Anesthetic complications: no  No notable events documented.  Last Vitals:  Vitals:   11/13/23 1030 11/13/23 1045  BP: 110/74 111/75  Pulse: 74 71  Resp: 13 14  Temp:    SpO2: 95% 92%    Last Pain:  Vitals:   11/13/23 0832  TempSrc: Oral                 Gregory Mcdonald

## 2023-11-13 NOTE — Anesthesia Procedure Notes (Signed)
 Procedure Name: LMA Insertion Date/Time: 11/13/2023 9:18 AM  Performed by: Pam Macario BROCKS, CRNAPre-anesthesia Checklist: Patient identified, Emergency Drugs available, Suction available, Patient being monitored and Timeout performed Patient Re-evaluated:Patient Re-evaluated prior to induction Oxygen Delivery Method: Circle system utilized Preoxygenation: Pre-oxygenation with 100% oxygen Induction Type: IV induction Ventilation: Mask ventilation without difficulty LMA: LMA inserted LMA Size: 5.0 Number of attempts: 1 Airway Equipment and Method: Bite block Placement Confirmation: positive ETCO2, breath sounds checked- equal and bilateral and CO2 detector Tube secured with: Tape Dental Injury: Teeth and Oropharynx as per pre-operative assessment

## 2023-11-14 ENCOUNTER — Encounter (HOSPITAL_BASED_OUTPATIENT_CLINIC_OR_DEPARTMENT_OTHER): Payer: Self-pay | Admitting: Urology

## 2023-11-14 LAB — SURGICAL PATHOLOGY
